# Patient Record
Sex: Male | Born: 1935
Health system: Southern US, Community
[De-identification: ages and names within clinical notes are randomized; demographics above are authoritative.]

## PROBLEM LIST (undated history)

## (undated) DIAGNOSIS — E785 Hyperlipidemia, unspecified: Secondary | ICD-10-CM

## (undated) DIAGNOSIS — E039 Hypothyroidism, unspecified: Secondary | ICD-10-CM

## (undated) HISTORY — PX: OTHER SURGICAL HISTORY: SHX169

---

## 2014-12-13 DIAGNOSIS — Z952 Presence of prosthetic heart valve: Secondary | ICD-10-CM | POA: Diagnosis not present

## 2014-12-13 DIAGNOSIS — I240 Acute coronary thrombosis not resulting in myocardial infarction: Secondary | ICD-10-CM | POA: Diagnosis not present

## 2015-01-11 DIAGNOSIS — E782 Mixed hyperlipidemia: Secondary | ICD-10-CM | POA: Diagnosis not present

## 2015-01-11 DIAGNOSIS — I240 Acute coronary thrombosis not resulting in myocardial infarction: Secondary | ICD-10-CM | POA: Diagnosis not present

## 2015-01-11 DIAGNOSIS — Z952 Presence of prosthetic heart valve: Secondary | ICD-10-CM | POA: Diagnosis not present

## 2015-01-11 DIAGNOSIS — D696 Thrombocytopenia, unspecified: Secondary | ICD-10-CM | POA: Diagnosis not present

## 2015-01-18 DIAGNOSIS — Z86010 Personal history of colonic polyps: Secondary | ICD-10-CM | POA: Diagnosis not present

## 2015-01-18 DIAGNOSIS — L400 Psoriasis vulgaris: Secondary | ICD-10-CM | POA: Diagnosis not present

## 2015-01-18 DIAGNOSIS — Z952 Presence of prosthetic heart valve: Secondary | ICD-10-CM | POA: Diagnosis not present

## 2015-01-18 DIAGNOSIS — E782 Mixed hyperlipidemia: Secondary | ICD-10-CM | POA: Diagnosis not present

## 2015-01-18 DIAGNOSIS — D696 Thrombocytopenia, unspecified: Secondary | ICD-10-CM | POA: Diagnosis not present

## 2015-02-22 DIAGNOSIS — Z952 Presence of prosthetic heart valve: Secondary | ICD-10-CM | POA: Diagnosis not present

## 2015-02-22 DIAGNOSIS — I240 Acute coronary thrombosis not resulting in myocardial infarction: Secondary | ICD-10-CM | POA: Diagnosis not present

## 2015-04-05 DIAGNOSIS — Z952 Presence of prosthetic heart valve: Secondary | ICD-10-CM | POA: Diagnosis not present

## 2015-05-08 DIAGNOSIS — Z952 Presence of prosthetic heart valve: Secondary | ICD-10-CM | POA: Diagnosis not present

## 2015-06-12 DIAGNOSIS — Z952 Presence of prosthetic heart valve: Secondary | ICD-10-CM | POA: Diagnosis not present

## 2015-07-17 DIAGNOSIS — Z952 Presence of prosthetic heart valve: Secondary | ICD-10-CM | POA: Diagnosis not present

## 2015-07-23 DIAGNOSIS — D696 Thrombocytopenia, unspecified: Secondary | ICD-10-CM | POA: Diagnosis not present

## 2015-07-23 DIAGNOSIS — E782 Mixed hyperlipidemia: Secondary | ICD-10-CM | POA: Diagnosis not present

## 2015-07-23 DIAGNOSIS — I2510 Atherosclerotic heart disease of native coronary artery without angina pectoris: Secondary | ICD-10-CM | POA: Diagnosis not present

## 2015-07-30 DIAGNOSIS — L400 Psoriasis vulgaris: Secondary | ICD-10-CM | POA: Diagnosis not present

## 2015-07-30 DIAGNOSIS — Z0001 Encounter for general adult medical examination with abnormal findings: Secondary | ICD-10-CM | POA: Diagnosis not present

## 2015-08-21 DIAGNOSIS — Z952 Presence of prosthetic heart valve: Secondary | ICD-10-CM | POA: Diagnosis not present

## 2015-08-30 DIAGNOSIS — Z23 Encounter for immunization: Secondary | ICD-10-CM | POA: Diagnosis not present

## 2015-09-18 DIAGNOSIS — Z952 Presence of prosthetic heart valve: Secondary | ICD-10-CM | POA: Diagnosis not present

## 2015-10-30 DIAGNOSIS — Z952 Presence of prosthetic heart valve: Secondary | ICD-10-CM | POA: Diagnosis not present

## 2015-12-04 DIAGNOSIS — I2510 Atherosclerotic heart disease of native coronary artery without angina pectoris: Secondary | ICD-10-CM | POA: Diagnosis not present

## 2015-12-04 DIAGNOSIS — D696 Thrombocytopenia, unspecified: Secondary | ICD-10-CM | POA: Diagnosis not present

## 2016-01-08 DIAGNOSIS — Z952 Presence of prosthetic heart valve: Secondary | ICD-10-CM | POA: Diagnosis not present

## 2016-01-24 DIAGNOSIS — Z86010 Personal history of colonic polyps: Secondary | ICD-10-CM | POA: Diagnosis not present

## 2016-01-24 DIAGNOSIS — D696 Thrombocytopenia, unspecified: Secondary | ICD-10-CM | POA: Diagnosis not present

## 2016-01-24 DIAGNOSIS — Z952 Presence of prosthetic heart valve: Secondary | ICD-10-CM | POA: Diagnosis not present

## 2016-01-24 DIAGNOSIS — E782 Mixed hyperlipidemia: Secondary | ICD-10-CM | POA: Diagnosis not present

## 2016-01-24 DIAGNOSIS — I2510 Atherosclerotic heart disease of native coronary artery without angina pectoris: Secondary | ICD-10-CM | POA: Diagnosis not present

## 2016-01-31 DIAGNOSIS — I2510 Atherosclerotic heart disease of native coronary artery without angina pectoris: Secondary | ICD-10-CM | POA: Diagnosis not present

## 2016-01-31 DIAGNOSIS — D696 Thrombocytopenia, unspecified: Secondary | ICD-10-CM | POA: Diagnosis not present

## 2016-01-31 DIAGNOSIS — E782 Mixed hyperlipidemia: Secondary | ICD-10-CM | POA: Diagnosis not present

## 2016-01-31 DIAGNOSIS — L400 Psoriasis vulgaris: Secondary | ICD-10-CM | POA: Diagnosis not present

## 2016-01-31 DIAGNOSIS — Z952 Presence of prosthetic heart valve: Secondary | ICD-10-CM | POA: Diagnosis not present

## 2016-01-31 DIAGNOSIS — Z1389 Encounter for screening for other disorder: Secondary | ICD-10-CM | POA: Diagnosis not present

## 2016-02-12 DIAGNOSIS — Z952 Presence of prosthetic heart valve: Secondary | ICD-10-CM | POA: Diagnosis not present

## 2016-03-18 DIAGNOSIS — I2510 Atherosclerotic heart disease of native coronary artery without angina pectoris: Secondary | ICD-10-CM | POA: Diagnosis not present

## 2016-03-18 DIAGNOSIS — Z952 Presence of prosthetic heart valve: Secondary | ICD-10-CM | POA: Diagnosis not present

## 2016-04-29 DIAGNOSIS — Z952 Presence of prosthetic heart valve: Secondary | ICD-10-CM | POA: Diagnosis not present

## 2016-06-03 DIAGNOSIS — D696 Thrombocytopenia, unspecified: Secondary | ICD-10-CM | POA: Diagnosis not present

## 2016-07-15 DIAGNOSIS — Z952 Presence of prosthetic heart valve: Secondary | ICD-10-CM | POA: Diagnosis not present

## 2016-07-19 DIAGNOSIS — B029 Zoster without complications: Secondary | ICD-10-CM | POA: Diagnosis not present

## 2016-08-14 DIAGNOSIS — I2510 Atherosclerotic heart disease of native coronary artery without angina pectoris: Secondary | ICD-10-CM | POA: Diagnosis not present

## 2016-08-14 DIAGNOSIS — Z952 Presence of prosthetic heart valve: Secondary | ICD-10-CM | POA: Diagnosis not present

## 2016-08-14 DIAGNOSIS — E782 Mixed hyperlipidemia: Secondary | ICD-10-CM | POA: Diagnosis not present

## 2016-08-14 DIAGNOSIS — D696 Thrombocytopenia, unspecified: Secondary | ICD-10-CM | POA: Diagnosis not present

## 2016-08-19 DIAGNOSIS — Z952 Presence of prosthetic heart valve: Secondary | ICD-10-CM | POA: Diagnosis not present

## 2016-08-21 DIAGNOSIS — L400 Psoriasis vulgaris: Secondary | ICD-10-CM | POA: Diagnosis not present

## 2016-08-21 DIAGNOSIS — Z0001 Encounter for general adult medical examination with abnormal findings: Secondary | ICD-10-CM | POA: Diagnosis not present

## 2016-08-21 DIAGNOSIS — Z1212 Encounter for screening for malignant neoplasm of rectum: Secondary | ICD-10-CM | POA: Diagnosis not present

## 2016-08-21 DIAGNOSIS — E782 Mixed hyperlipidemia: Secondary | ICD-10-CM | POA: Diagnosis not present

## 2016-08-21 DIAGNOSIS — Z23 Encounter for immunization: Secondary | ICD-10-CM | POA: Diagnosis not present

## 2016-08-21 DIAGNOSIS — I2510 Atherosclerotic heart disease of native coronary artery without angina pectoris: Secondary | ICD-10-CM | POA: Diagnosis not present

## 2016-08-21 DIAGNOSIS — D696 Thrombocytopenia, unspecified: Secondary | ICD-10-CM | POA: Diagnosis not present

## 2016-09-23 DIAGNOSIS — Z952 Presence of prosthetic heart valve: Secondary | ICD-10-CM | POA: Diagnosis not present

## 2016-10-21 DIAGNOSIS — Z952 Presence of prosthetic heart valve: Secondary | ICD-10-CM | POA: Diagnosis not present

## 2016-11-25 DIAGNOSIS — Z952 Presence of prosthetic heart valve: Secondary | ICD-10-CM | POA: Diagnosis not present

## 2016-12-30 DIAGNOSIS — Z952 Presence of prosthetic heart valve: Secondary | ICD-10-CM | POA: Diagnosis not present

## 2017-01-13 DIAGNOSIS — H353132 Nonexudative age-related macular degeneration, bilateral, intermediate dry stage: Secondary | ICD-10-CM | POA: Diagnosis not present

## 2017-01-13 DIAGNOSIS — H53001 Unspecified amblyopia, right eye: Secondary | ICD-10-CM | POA: Diagnosis not present

## 2017-01-13 DIAGNOSIS — H524 Presbyopia: Secondary | ICD-10-CM | POA: Diagnosis not present

## 2017-02-10 DIAGNOSIS — Z952 Presence of prosthetic heart valve: Secondary | ICD-10-CM | POA: Diagnosis not present

## 2017-02-17 DIAGNOSIS — D696 Thrombocytopenia, unspecified: Secondary | ICD-10-CM | POA: Diagnosis not present

## 2017-02-17 DIAGNOSIS — I2510 Atherosclerotic heart disease of native coronary artery without angina pectoris: Secondary | ICD-10-CM | POA: Diagnosis not present

## 2017-02-17 DIAGNOSIS — Z9189 Other specified personal risk factors, not elsewhere classified: Secondary | ICD-10-CM | POA: Diagnosis not present

## 2017-02-17 DIAGNOSIS — E782 Mixed hyperlipidemia: Secondary | ICD-10-CM | POA: Diagnosis not present

## 2017-02-17 DIAGNOSIS — Z86010 Personal history of colonic polyps: Secondary | ICD-10-CM | POA: Diagnosis not present

## 2017-02-20 DIAGNOSIS — I2510 Atherosclerotic heart disease of native coronary artery without angina pectoris: Secondary | ICD-10-CM | POA: Diagnosis not present

## 2017-02-20 DIAGNOSIS — Z952 Presence of prosthetic heart valve: Secondary | ICD-10-CM | POA: Diagnosis not present

## 2017-02-20 DIAGNOSIS — Z23 Encounter for immunization: Secondary | ICD-10-CM | POA: Diagnosis not present

## 2017-02-20 DIAGNOSIS — D696 Thrombocytopenia, unspecified: Secondary | ICD-10-CM | POA: Diagnosis not present

## 2017-02-20 DIAGNOSIS — L400 Psoriasis vulgaris: Secondary | ICD-10-CM | POA: Diagnosis not present

## 2017-02-20 DIAGNOSIS — E782 Mixed hyperlipidemia: Secondary | ICD-10-CM | POA: Diagnosis not present

## 2017-03-17 DIAGNOSIS — Z952 Presence of prosthetic heart valve: Secondary | ICD-10-CM | POA: Diagnosis not present

## 2017-04-21 DIAGNOSIS — Z952 Presence of prosthetic heart valve: Secondary | ICD-10-CM | POA: Diagnosis not present

## 2017-05-26 DIAGNOSIS — Z952 Presence of prosthetic heart valve: Secondary | ICD-10-CM | POA: Diagnosis not present

## 2017-06-30 DIAGNOSIS — Z952 Presence of prosthetic heart valve: Secondary | ICD-10-CM | POA: Diagnosis not present

## 2017-08-11 DIAGNOSIS — Z952 Presence of prosthetic heart valve: Secondary | ICD-10-CM | POA: Diagnosis not present

## 2017-08-31 DIAGNOSIS — R5383 Other fatigue: Secondary | ICD-10-CM | POA: Diagnosis not present

## 2017-08-31 DIAGNOSIS — E782 Mixed hyperlipidemia: Secondary | ICD-10-CM | POA: Diagnosis not present

## 2017-09-02 DIAGNOSIS — L400 Psoriasis vulgaris: Secondary | ICD-10-CM | POA: Diagnosis not present

## 2017-09-02 DIAGNOSIS — Z0001 Encounter for general adult medical examination with abnormal findings: Secondary | ICD-10-CM | POA: Diagnosis not present

## 2017-09-02 DIAGNOSIS — D696 Thrombocytopenia, unspecified: Secondary | ICD-10-CM | POA: Diagnosis not present

## 2017-09-02 DIAGNOSIS — Z23 Encounter for immunization: Secondary | ICD-10-CM | POA: Diagnosis not present

## 2017-09-22 DIAGNOSIS — Z952 Presence of prosthetic heart valve: Secondary | ICD-10-CM | POA: Diagnosis not present

## 2017-10-27 DIAGNOSIS — Z952 Presence of prosthetic heart valve: Secondary | ICD-10-CM | POA: Diagnosis not present

## 2017-11-03 DIAGNOSIS — Z952 Presence of prosthetic heart valve: Secondary | ICD-10-CM | POA: Diagnosis not present

## 2017-12-02 DIAGNOSIS — Z952 Presence of prosthetic heart valve: Secondary | ICD-10-CM | POA: Diagnosis not present

## 2017-12-30 DIAGNOSIS — Z952 Presence of prosthetic heart valve: Secondary | ICD-10-CM | POA: Diagnosis not present

## 2018-02-03 DIAGNOSIS — Z952 Presence of prosthetic heart valve: Secondary | ICD-10-CM | POA: Diagnosis not present

## 2018-02-25 DIAGNOSIS — D696 Thrombocytopenia, unspecified: Secondary | ICD-10-CM | POA: Diagnosis not present

## 2018-02-25 DIAGNOSIS — Z952 Presence of prosthetic heart valve: Secondary | ICD-10-CM | POA: Diagnosis not present

## 2018-02-25 DIAGNOSIS — I2510 Atherosclerotic heart disease of native coronary artery without angina pectoris: Secondary | ICD-10-CM | POA: Diagnosis not present

## 2018-02-25 DIAGNOSIS — E782 Mixed hyperlipidemia: Secondary | ICD-10-CM | POA: Diagnosis not present

## 2018-02-25 DIAGNOSIS — Z9189 Other specified personal risk factors, not elsewhere classified: Secondary | ICD-10-CM | POA: Diagnosis not present

## 2018-03-02 DIAGNOSIS — L400 Psoriasis vulgaris: Secondary | ICD-10-CM | POA: Diagnosis not present

## 2018-03-02 DIAGNOSIS — D696 Thrombocytopenia, unspecified: Secondary | ICD-10-CM | POA: Diagnosis not present

## 2018-03-02 DIAGNOSIS — Z952 Presence of prosthetic heart valve: Secondary | ICD-10-CM | POA: Diagnosis not present

## 2018-03-02 DIAGNOSIS — E782 Mixed hyperlipidemia: Secondary | ICD-10-CM | POA: Diagnosis not present

## 2018-03-02 DIAGNOSIS — I2510 Atherosclerotic heart disease of native coronary artery without angina pectoris: Secondary | ICD-10-CM | POA: Diagnosis not present

## 2018-03-17 DIAGNOSIS — Z952 Presence of prosthetic heart valve: Secondary | ICD-10-CM | POA: Diagnosis not present

## 2018-04-13 DIAGNOSIS — R5383 Other fatigue: Secondary | ICD-10-CM | POA: Diagnosis not present

## 2018-04-28 DIAGNOSIS — Z952 Presence of prosthetic heart valve: Secondary | ICD-10-CM | POA: Diagnosis not present

## 2018-05-26 DIAGNOSIS — R5383 Other fatigue: Secondary | ICD-10-CM | POA: Diagnosis not present

## 2018-05-26 DIAGNOSIS — R946 Abnormal results of thyroid function studies: Secondary | ICD-10-CM | POA: Diagnosis not present

## 2018-05-27 ENCOUNTER — Encounter (HOSPITAL_COMMUNITY): Payer: Self-pay

## 2018-05-27 ENCOUNTER — Inpatient Hospital Stay (HOSPITAL_COMMUNITY)
Admission: AD | Admit: 2018-05-27 | Discharge: 2018-06-02 | DRG: 813 | Disposition: A | Discharge status: Home / Self Care | Payer: Medicare Other | Source: Other Acute Inpatient Hospital | Attending: Internal Medicine | Admitting: Internal Medicine

## 2018-05-27 ENCOUNTER — Other Ambulatory Visit: Payer: Self-pay

## 2018-05-27 ENCOUNTER — Inpatient Hospital Stay (HOSPITAL_COMMUNITY): Payer: Medicare Other

## 2018-05-27 DIAGNOSIS — R531 Weakness: Secondary | ICD-10-CM | POA: Diagnosis not present

## 2018-05-27 DIAGNOSIS — Z7982 Long term (current) use of aspirin: Secondary | ICD-10-CM

## 2018-05-27 DIAGNOSIS — S3991XA Unspecified injury of abdomen, initial encounter: Secondary | ICD-10-CM | POA: Diagnosis not present

## 2018-05-27 DIAGNOSIS — J189 Pneumonia, unspecified organism: Secondary | ICD-10-CM | POA: Diagnosis not present

## 2018-05-27 DIAGNOSIS — Z952 Presence of prosthetic heart valve: Secondary | ICD-10-CM | POA: Diagnosis not present

## 2018-05-27 DIAGNOSIS — K922 Gastrointestinal hemorrhage, unspecified: Secondary | ICD-10-CM | POA: Diagnosis not present

## 2018-05-27 DIAGNOSIS — K2901 Acute gastritis with bleeding: Secondary | ICD-10-CM | POA: Diagnosis not present

## 2018-05-27 DIAGNOSIS — K921 Melena: Secondary | ICD-10-CM | POA: Diagnosis not present

## 2018-05-27 DIAGNOSIS — K2950 Unspecified chronic gastritis without bleeding: Secondary | ICD-10-CM | POA: Diagnosis not present

## 2018-05-27 DIAGNOSIS — R0600 Dyspnea, unspecified: Secondary | ICD-10-CM

## 2018-05-27 DIAGNOSIS — I38 Endocarditis, valve unspecified: Secondary | ICD-10-CM | POA: Diagnosis not present

## 2018-05-27 DIAGNOSIS — J181 Lobar pneumonia, unspecified organism: Secondary | ICD-10-CM | POA: Diagnosis present

## 2018-05-27 DIAGNOSIS — R042 Hemoptysis: Secondary | ICD-10-CM | POA: Diagnosis not present

## 2018-05-27 DIAGNOSIS — K2970 Gastritis, unspecified, without bleeding: Secondary | ICD-10-CM | POA: Diagnosis present

## 2018-05-27 DIAGNOSIS — T45515A Adverse effect of anticoagulants, initial encounter: Secondary | ICD-10-CM | POA: Diagnosis present

## 2018-05-27 DIAGNOSIS — R195 Other fecal abnormalities: Secondary | ICD-10-CM | POA: Diagnosis not present

## 2018-05-27 DIAGNOSIS — E039 Hypothyroidism, unspecified: Secondary | ICD-10-CM

## 2018-05-27 DIAGNOSIS — E785 Hyperlipidemia, unspecified: Secondary | ICD-10-CM | POA: Diagnosis present

## 2018-05-27 DIAGNOSIS — I700 Atherosclerosis of aorta: Secondary | ICD-10-CM | POA: Diagnosis not present

## 2018-05-27 DIAGNOSIS — Z79899 Other long term (current) drug therapy: Secondary | ICD-10-CM

## 2018-05-27 DIAGNOSIS — K449 Diaphragmatic hernia without obstruction or gangrene: Secondary | ICD-10-CM | POA: Diagnosis present

## 2018-05-27 DIAGNOSIS — D6489 Other specified anemias: Secondary | ICD-10-CM | POA: Diagnosis not present

## 2018-05-27 DIAGNOSIS — D689 Coagulation defect, unspecified: Secondary | ICD-10-CM

## 2018-05-27 DIAGNOSIS — Z86010 Personal history of colonic polyps: Secondary | ICD-10-CM

## 2018-05-27 DIAGNOSIS — Z7901 Long term (current) use of anticoagulants: Secondary | ICD-10-CM | POA: Diagnosis not present

## 2018-05-27 DIAGNOSIS — D62 Acute posthemorrhagic anemia: Secondary | ICD-10-CM | POA: Diagnosis not present

## 2018-05-27 DIAGNOSIS — Z87891 Personal history of nicotine dependence: Secondary | ICD-10-CM | POA: Diagnosis not present

## 2018-05-27 DIAGNOSIS — R05 Cough: Secondary | ICD-10-CM | POA: Diagnosis not present

## 2018-05-27 DIAGNOSIS — I712 Thoracic aortic aneurysm, without rupture: Secondary | ICD-10-CM | POA: Diagnosis not present

## 2018-05-27 DIAGNOSIS — K224 Dyskinesia of esophagus: Secondary | ICD-10-CM | POA: Diagnosis present

## 2018-05-27 DIAGNOSIS — D6832 Hemorrhagic disorder due to extrinsic circulating anticoagulants: Secondary | ICD-10-CM | POA: Diagnosis not present

## 2018-05-27 DIAGNOSIS — D500 Iron deficiency anemia secondary to blood loss (chronic): Secondary | ICD-10-CM | POA: Diagnosis not present

## 2018-05-27 DIAGNOSIS — R1031 Right lower quadrant pain: Secondary | ICD-10-CM | POA: Diagnosis not present

## 2018-05-27 DIAGNOSIS — K920 Hematemesis: Secondary | ICD-10-CM | POA: Diagnosis not present

## 2018-05-27 DIAGNOSIS — R0902 Hypoxemia: Secondary | ICD-10-CM | POA: Diagnosis not present

## 2018-05-27 DIAGNOSIS — R0602 Shortness of breath: Secondary | ICD-10-CM | POA: Diagnosis not present

## 2018-05-27 HISTORY — DX: Hypothyroidism, unspecified: E03.9

## 2018-05-27 HISTORY — DX: Hyperlipidemia, unspecified: E78.5

## 2018-05-27 LAB — CBC WITH DIFFERENTIAL/PLATELET
BASOS PCT: 0 %
Basophils Absolute: 0 10*3/uL (ref 0.0–0.1)
Eosinophils Absolute: 0.1 10*3/uL (ref 0.0–0.7)
Eosinophils Relative: 1 %
HCT: 24.9 % — ABNORMAL LOW (ref 39.0–52.0)
HEMOGLOBIN: 8.4 g/dL — AB (ref 13.0–17.0)
Lymphocytes Relative: 7 %
Lymphs Abs: 0.6 10*3/uL — ABNORMAL LOW (ref 0.7–4.0)
MCH: 32.1 pg (ref 26.0–34.0)
MCHC: 33.7 g/dL (ref 30.0–36.0)
MCV: 95 fL (ref 78.0–100.0)
MONOS PCT: 7 %
Monocytes Absolute: 0.5 10*3/uL (ref 0.1–1.0)
Neutro Abs: 6.7 10*3/uL (ref 1.7–7.7)
Neutrophils Relative %: 85 %
Platelets: 134 10*3/uL — ABNORMAL LOW (ref 150–400)
RBC: 2.62 MIL/uL — ABNORMAL LOW (ref 4.22–5.81)
RDW: 15.3 % (ref 11.5–15.5)
WBC: 7.9 10*3/uL (ref 4.0–10.5)

## 2018-05-27 LAB — BASIC METABOLIC PANEL
ANION GAP: 7 (ref 5–15)
BUN: 14 mg/dL (ref 6–20)
CHLORIDE: 105 mmol/L (ref 101–111)
CO2: 26 mmol/L (ref 22–32)
CREATININE: 1.23 mg/dL (ref 0.61–1.24)
Calcium: 7.9 mg/dL — ABNORMAL LOW (ref 8.9–10.3)
GFR calc non Af Amer: 52 mL/min — ABNORMAL LOW (ref >60)
Glucose, Bld: 119 mg/dL — ABNORMAL HIGH (ref 65–99)
Potassium: 3.3 mmol/L — ABNORMAL LOW (ref 3.5–5.1)
Sodium: 138 mmol/L (ref 135–145)

## 2018-05-27 MED ORDER — GUAIFENESIN-DM 100-10 MG/5ML PO SYRP
5.0000 mL | ORAL_SOLUTION | Freq: Four times a day (QID) | ORAL | Status: DC
  Administered 2018-05-27 – 2018-06-02 (×21): 5 mL via ORAL
  Filled 2018-05-27 (×23): qty 10

## 2018-05-27 MED ORDER — SODIUM CHLORIDE 0.9% FLUSH
3.0000 mL | Freq: Two times a day (BID) | INTRAVENOUS | Status: DC
  Administered 2018-05-28 – 2018-05-30 (×5): 3 mL via INTRAVENOUS

## 2018-05-27 MED ORDER — LEVOTHYROXINE SODIUM 100 MCG PO TABS
100.0000 ug | ORAL_TABLET | Freq: Every day | ORAL | Status: DC
  Administered 2018-05-28 – 2018-06-02 (×5): 100 ug via ORAL
  Filled 2018-05-27 (×5): qty 1

## 2018-05-27 MED ORDER — SODIUM CHLORIDE 0.9% FLUSH
3.0000 mL | INTRAVENOUS | Status: DC | PRN
  Administered 2018-06-02: 3 mL via INTRAVENOUS
  Filled 2018-05-27: qty 3

## 2018-05-27 MED ORDER — ONDANSETRON HCL 4 MG/2ML IJ SOLN: 4.0000 mg | Freq: Four times a day (QID) | INTRAMUSCULAR | Status: DC | PRN

## 2018-05-27 MED ORDER — SODIUM CHLORIDE 0.9 % IV SOLN: 250.0000 mL | INTRAVENOUS | Status: DC | PRN

## 2018-05-27 MED ORDER — IPRATROPIUM-ALBUTEROL 0.5-2.5 (3) MG/3ML IN SOLN
3.0000 mL | Freq: Three times a day (TID) | RESPIRATORY_TRACT | Status: DC
  Administered 2018-05-28 – 2018-05-31 (×10): 3 mL via RESPIRATORY_TRACT
  Filled 2018-05-27 (×10): qty 3

## 2018-05-27 MED ORDER — PIPERACILLIN-TAZOBACTAM 3.375 G IVPB
3.3750 g | Freq: Three times a day (TID) | INTRAVENOUS | Status: DC
  Administered 2018-05-27 – 2018-05-29 (×5): 3.375 g via INTRAVENOUS
  Filled 2018-05-27 (×6): qty 50

## 2018-05-27 MED ORDER — ACETAMINOPHEN 325 MG PO TABS: 650.0000 mg | ORAL_TABLET | Freq: Four times a day (QID) | ORAL | Status: DC | PRN

## 2018-05-27 MED ORDER — SIMVASTATIN 40 MG PO TABS
40.0000 mg | ORAL_TABLET | Freq: Every evening | ORAL | Status: DC
  Administered 2018-05-27 – 2018-06-01 (×6): 40 mg via ORAL
  Filled 2018-05-27 (×6): qty 1

## 2018-05-27 MED ORDER — ONDANSETRON HCL 4 MG PO TABS: 4.0000 mg | ORAL_TABLET | Freq: Four times a day (QID) | ORAL | Status: DC | PRN

## 2018-05-27 MED ORDER — IPRATROPIUM-ALBUTEROL 0.5-2.5 (3) MG/3ML IN SOLN
3.0000 mL | Freq: Four times a day (QID) | RESPIRATORY_TRACT | Status: DC
  Administered 2018-05-27: 3 mL via RESPIRATORY_TRACT
  Filled 2018-05-27: qty 3

## 2018-05-27 MED ORDER — ACETAMINOPHEN 650 MG RE SUPP: 650.0000 mg | Freq: Four times a day (QID) | RECTAL | Status: DC | PRN

## 2018-05-27 MED ORDER — IPRATROPIUM-ALBUTEROL 0.5-2.5 (3) MG/3ML IN SOLN: 3.0000 mL | RESPIRATORY_TRACT | Status: DC | PRN

## 2018-05-27 NOTE — H&P (Signed)
History and Physical    Eric JakschJimmy Marks ZOX:[id number] DOB: 05/07/1931 DOA: 05/27/2018  PCP: Eric Marks, Eric G, MD   Patient coming [facility]: [facility] department.  Chief Complaint: Hemoptysis.  HPI: Eric Marks is a 84 y.o. male with medical history significant of Saint Jude valve replacement, anticoagulated with warfarin, dyslipidemia and hypo-thyroidism.  Patient was diagnosed with acute bronchitis about 2 weeks ago, due to coughing, dyspnea and wheezing.  He received about 5 days of amoxicillin with a transient improvement of his symptoms.  The next 9 days he developed recurrent dyspnea, associated with wheezing and coughing, worse with exertion, no improving factors, moderate to severe in intensity.  He had noticed hemoptysis, associated with phlegm, no frank bright blood or blood clots expectorated.  He also had noticed blood in his stools mainly when he moves his bowels.  He was evaluated at the emergency department and [facility], where he is found to be severely anemic and coagulopathic.  Denies any abdominal or chest pain.  He quit smoking in 1997, until then he smoked about 1 pack of cigarettes daily since he was 84 years old.  He does not have nebulizers or inhalers at home, he has been not diagnosed with COPD or bronchitis.  Denies any seasonal or recurrent respiratory symptoms.   ED Course: Patient was found to be hemodynamically stable but anemic with hemoglobin of 6.6, INR was 6.8, he was transfused 1 unit of packed red blood cells.  Further work-up revealed a left lower lobe pneumonia and received IV Zosyn.  Pulmonary critical care Dr. Delton CoombesByrum was contacted for consultation.  No gastroenterology or pulmon[facility].   Review of Systems:  1. General: No fevers, no chills, no weight gain or weight loss 2. ENT: No runny nose or sore throat, no hearing disturbances 3. Pulmonary: Positive dyspnea, cough, wheezing, and hemoptysis as mentioned in  HPI 4. Cardiovascular: No angina, claudication, lower extremity edema, pnd or orthopnea 5. Gastrointestinal: Ocacsional nausea but no vomiting, no diarrhea or constipation 6. Hematology: No easy bruisability or frequent infections 7. Urology: No dysuria, hematuria or increased urinary frequency 8. Dermatology: No rashes. 9. Neurology: No seizures or paresthesias 10. Musculoskeletal: No joint pain or deformities  Past Medical History:  Diagnosis Date  . Hyperlipidemia   . Hypothyroidism     Past Surgical History:  Procedure Laterality Date  . St jude mechanical valve       reports that he quit smoking about 21 years ago. His smoking use included cigarettes. He smoked 1.00 pack per day. He has never used smokeless tobacco. He reports that he does not drink alcohol or use drugs.  Allergies  Allergen Reactions  . No Known Allergies     History reviewed. No pertinent family history.   Prior to Admission medications   Medication Sig Start Date End Date Taking? Authorizing Provider  acetaminophen (TYLENOL) 325 MG tablet Take 650 mg by mouth every 6 (six) hours as needed for moderate pain or fever.    Yes [provider]  aspirin EC 81 MG tablet Take 81 mg by mouth every evening.   Yes [provider]  levothyroxine (SYNTHROID, LEVOTHROID) 100 MCG tablet TAKE 1 TABLET BY MOUTH DAILY FOR HYPOTHYROIDISM 04/14/18  Yes [provider]  simvastatin (ZOCOR) 40 MG tablet Take 40 mg by mouth every evening.  04/27/18  Yes [provider]  warfarin (COUMADIN) 6 MG tablet Take 6 mg by mouth every evening.  04/27/18  Yes [provider]    Physical Exam: Vitals:   05/27/18 1702 05/27/18 1704  BP: (!) 119/96   Pulse: 76   Resp: 17   Temp: 98.4 F (36.9 C)   TempSrc: Oral   SpO2: 100%   Weight:  91.8 kg (202 lb 6.1 oz)  Height:  6\' 2"  (1.88 m)    Constitutional: deonditioned Vitals:   05/27/18 1702 05/27/18 1704  BP: (!) 119/96   Pulse: 76     Resp: 17   Temp: 98.4 F (36.9 C)   TempSrc: Oral   SpO2: 100%   Weight:  91.8 kg (202 lb 6.1 oz)  Height:  6\' 2"  (1.88 m)   Eyes: PERRL, lids and conjunctivae normal Head normocephalic, nose and ears with no deformities ENMT: Mucous membranes are dry. Posterior pharynx clear of any exudate or lesions.Normal dentition.  Neck: normal, supple, no masses, no thyromegaly Respiratory:  Decreased breath sounds on auscultation bilaterally at bases, with no wheezing,but bibasilar crackles more left than right. Normal respiratory effort. No accessory muscle use.  Cardiovascular: Regular rate and rhythm, no murmurs / rubs / gallops. No extremity edema. 2+ pedal pulses. No carotid bruits. Mechanical click S2.  Abdomen: no tenderness, no masses palpated. No hepatosplenomegaly. Bowel sounds positive.  Musculoskeletal: no clubbing / cyanosis. No joint deformity upper and lower extremities. Good ROM, no contractures. Normal muscle tone.  Skin: no rashes, lesions, ulcers. No induration Neurologic: CN 2-12 grossly intact. Sensation intact, DTR normal. Strength 5/5 in all 4.     Labs on Admission: I have personally reviewed following labs and imaging studies  CBC: No results for input(s): WBC, NEUTROABS, HGB, HCT, MCV, PLT in the last 168 hours. Basic Metabolic Panel: No results for input(s): NA, K, CL, CO2, GLUCOSE, BUN, CREATININE, CALCIUM, MG, PHOS in the last 168 hours. GFR: CrCl cannot be calculated (No order found.). Liver Function Tests: No results for input(s): AST, ALT, ALKPHOS, BILITOT, PROT, ALBUMIN in the last 168 hours. No results for input(s): LIPASE, AMYLASE in the last 168 hours. No results for input(s): AMMONIA in the last 168 hours. Coagulation Profile: No results for input(s): INR, PROTIME in the last 168 hours. Cardiac Enzymes: No results for input(s): CKTOTAL, CKMB, CKMBINDEX, TROPONINI in the last 168 hours. BNP (last 3 results) No results for input(s): PROBNP in the last  8760 hours. HbA1C: No results for input(s): HGBA1C in the last 72 hours. CBG: No results for input(s): GLUCAP in the last 168 hours. Lipid Profile: No results for input(s): CHOL, HDL, LDLCALC, TRIG, CHOLHDL, LDLDIRECT in the last 72 hours. Thyroid Function Tests: No results for input(s): TSH, T4TOTAL, FREET4, T3FREE, THYROIDAB in the last 72 hours. Anemia Panel: No results for input(s): VITAMINB12, FOLATE, FERRITIN, TIBC, IRON, RETICCTPCT in the last 72 hours. Urine analysis: No results found for: COLORURINE, APPEARANCEUR, LABSPEC, PHURINE, GLUCOSEU, HGBUR, BILIRUBINUR, KETONESUR, PROTEINUR, UROBILINOGEN, NITRITE, LEUKOCYTESUR  Radiological Exams on Admission: No results found.  EKG: Independently reviewed. NA  Assessment/Plan Active Problems:   Pneumonia  84 year old male who presented with acute symptomatic anemia, for the last 2 weeks he has been experiencing coughing, wheezing and dyspnea.  Reported hemoptysis and bright red blood per rectum.  He has been anticoagulated with warfarin, he did receive antibiotic therapy as an outpatient.  On his initial physical examination temperature 98.4, blood pressure 119/96, heart rate 76, respiratory rate 17, oxygen saturation 100% on 2 L nasal cannula.  Mild pale, lungs with decreased breath sounds at bases bilaterally with rales at the left  base, heart S1-S2 present, rhythmic, mechanical click/S2, abdomen soft nontender, no lower extremity edema.  Out-of-hospital INR 6.6, hemoglobin 6.8.  Reported chest CT with left lower lobe infiltrate.  Patient will be admitted to the hospital with a working diagnosis of acute symptomatic anemia due to vitamin K antagonist coagulopathy, complicated by left lower lobe pneumonia with hemoptysis and hematochezia.   1.  Acute symptomatic anemia, related to acute blood loss (gastrointestinal and pulmonary), due to vitamin K antagonist coagulopathy.  Patient received 1 unit of packed red blood cells, will continue  hemoglobin and hematocrit monitoring every 6 hours.  Will further transfuse PRBC, to target hemoglobin above 7.  Will stop warfarin, follow-up closely INR, in the setting of a mechanical valve will avoid acute correction of INR, unless patient has hemodynamic decompensation related to bleeding.  2.  Left lower lobe pneumonia.  Will repeat chest radiograph, for now continue antibiotic therapy with intravenous Zosyn.  Oximetry monitoring, supplemental oxygen per nasal cannula, bronchodilator therapy with DuoNeb.  Will collect sputum to quantify hemoptysis, likely this is not a case of massive hemoptysis.  Antitussive therapy with guaifenesin dextromethorphan.   3.  Dyslipidemia.  Continue simvastatin.  Stop aspirin.  4.  Hypothyroidism.  Continue levothyroxine.   DVT prophylaxis: scd  Code Status: full  Family Communication: I spoke with patient's family at the bedside and all questions were addressed.   Disposition Plan: to be determined  Consults called: none   Admission status: Inpatient.     Aquilla Shambley Annett Gula MD [facility] Pager [phone]  If 7PM-7AM, please contact night-coverage [email or web address] Password [facility]  05/27/2018, 5:39 PM

## 2018-05-27 NOTE — Progress Notes (Signed)
Patient had a 5 beat run of vtach. Pt up to the bathroom at time of run of vtach. Pt asymptomatic. VSS. NP on call notified.

## 2018-05-27 NOTE — Progress Notes (Signed)
Pharmacy Antibiotic Note  Eric Marks is a 84 y.o. male admitted on 05/27/2018 with pneumonia.  Pharmacy has been consulted for Zosyn dosing.  Plan: Zosyn 3.375g IV q8h (4 hour infusion).  Will sign off  Height: 6\' 2"  (188 cm) Weight: 202 lb 6.1 oz (91.8 kg) IBW/kg (Calculated) : 82.2  Temp (24hrs), Avg:98.4 F (36.9 C), Min:98.4 F (36.9 C), Max:98.4 F (36.9 C)  No results for input(s): WBC, CREATININE, LATICACIDVEN, VANCOTROUGH, VANCOPEAK, VANCORANDOM, GENTTROUGH, GENTPEAK, GENTRANDOM, TOBRATROUGH, TOBRAPEAK, TOBRARND, AMIKACINPEAK, AMIKACINTROU, AMIKACIN in the last 168 hours.  CrCl cannot be calculated (No order found.).    Allergies  Allergen Reactions  . No Known Allergies      Thank you for allowing pharmacy to be a part of this patient's care.   Hessie KnowsJustin M Kalan Marks, PharmD, BCPS Pager [phone] 05/27/2018 6:18 PM

## 2018-05-28 LAB — PROTIME-INR
INR: 2.29
Prothrombin Time: 25 seconds — ABNORMAL HIGH (ref 11.4–15.2)

## 2018-05-28 LAB — COMPREHENSIVE METABOLIC PANEL
ALT: 18 U/L (ref 17–63)
AST: 49 U/L — ABNORMAL HIGH (ref 15–41)
Albumin: 3.1 g/dL — ABNORMAL LOW (ref 3.5–5.0)
Alkaline Phosphatase: 37 U/L — ABNORMAL LOW (ref 38–126)
Anion gap: 8 (ref 5–15)
BUN: 14 mg/dL (ref 6–20)
CO2: 25 mmol/L (ref 22–32)
Calcium: 8 mg/dL — ABNORMAL LOW (ref 8.9–10.3)
Chloride: 105 mmol/L (ref 101–111)
Creatinine, Ser: 1.08 mg/dL (ref 0.61–1.24)
GFR calc Af Amer: >60 mL/min (ref >60)
GFR calc non Af Amer: >60 mL/min (ref >60)
Glucose, Bld: 122 mg/dL — ABNORMAL HIGH (ref 65–99)
Potassium: 3.6 mmol/L (ref 3.5–5.1)
Sodium: 138 mmol/L (ref 135–145)
Total Bilirubin: 1.2 mg/dL (ref 0.3–1.2)
Total Protein: 5.8 g/dL — ABNORMAL LOW (ref 6.5–8.1)

## 2018-05-28 LAB — HEMOGLOBIN AND HEMATOCRIT, BLOOD
HCT: 24 % — ABNORMAL LOW (ref 39.0–52.0)
HCT: 28.5 % — ABNORMAL LOW (ref 39.0–52.0)
HEMATOCRIT: 23.9 % — AB (ref 39.0–52.0)
Hemoglobin: 8.1 g/dL — ABNORMAL LOW (ref 13.0–17.0)
Hemoglobin: 8.1 g/dL — ABNORMAL LOW (ref 13.0–17.0)
Hemoglobin: 9.5 g/dL — ABNORMAL LOW (ref 13.0–17.0)

## 2018-05-28 MED ORDER — POTASSIUM CHLORIDE CRYS ER 20 MEQ PO TBCR
40.0000 meq | EXTENDED_RELEASE_TABLET | Freq: Once | ORAL | Status: AC
  Administered 2018-05-28: 40 meq via ORAL
  Filled 2018-05-28: qty 2

## 2018-05-28 MED ORDER — POTASSIUM CHLORIDE CRYS ER 20 MEQ PO TBCR: 20.0000 meq | EXTENDED_RELEASE_TABLET | Freq: Once | ORAL | Status: DC

## 2018-05-28 MED ORDER — WARFARIN SODIUM 3 MG PO TABS
3.0000 mg | ORAL_TABLET | Freq: Once | ORAL | Status: AC
  Administered 2018-05-28: 3 mg via ORAL
  Filled 2018-05-28: qty 1

## 2018-05-28 MED ORDER — WARFARIN - PHARMACIST DOSING INPATIENT: Freq: Every day | Status: DC

## 2018-05-28 NOTE — Progress Notes (Signed)
PROGRESS NOTE    Eric Marks  ZOX:[id number] DOB: Nov 09, 1931 DOA: 05/27/2018 PCP: Richardean Chimeraaniel, Terry G, MD    Brief Narrative:  84 year old male with past medical history for aortic mechanical valve replacement and dyslipidemia, who presented with acute symptomatic anemia, for the last 2 weeks he has been experiencing coughing, wheezing and dyspnea.  Reported hemoptysis and bright red blood per rectum.  He has been anticoagulated with warfarin, he did receive antibiotic therapy as an outpatient.  On his initial physical examination temperature 98.4, blood pressure 119/96, heart rate 76, respiratory rate 17, oxygen saturation 100% on 2 L nasal cannula.  Mild pale, lungs with decreased breath sounds at bases bilaterally with rales at the left base, heart S1-S2 present, rhythmic, mechanical click/S2, abdomen soft nontender, no lower extremity edema.  Out-of-hospital INR 6.6, hemoglobin 6.8.  Reported chest CT with left lower lobe infiltrate.  Patient wias admitted to the hospital with a working diagnosis of acute symptomatic anemia due to vitamin K antagonist coagulopathy, complicated by left lower lobe pneumonia with hemoptysis and hematochezia.    Assessment & Plan:   Active Problems:   Pneumonia  1. Acute symptomatic anemia, related to acute blood loss (GI and Pulm), in the setting of vitamin K antagonist coagulopathy. No further bleeding, hb stable at 8 after one unit prbc transfusion, INR down to 2,2. Will continue to follow on hb and hct, out of bed as tolerated and physical therapy evaluation.   2. Aortic mechanical valve. No clinical signs of heart failure, target INR 2 to 3, will resume warfarin tonight per pharmacy protocol and will continue close monitoring for signs of bleeding.   3. Left lower lobe pneumonia (present on admission). Chest film personally reviewed and confirmed left lower lobe infiltrate, also out of hospital CT personally reviewed with evidence of left lower lobe  infiltrate. Will continue antibiotic therapy with Zosyn. Patient oxygenating well, no fever or leukocytosis, no further cough or hemoptysis. Continue bronchodilator therapy and oxymetry monitoring. Oxygen saturation 95% on room air.   4. Acute lower GI bleed. No further hematochezia, will resume anticoagulation, patient tolerating po well. No clinical signs of significant source of gi bleeding.   5. Dyslipidemia. Continue simvastatin.  6. Hypothyroid. Continue levothyroxine.    DVT prophylaxis: warfarin   Code Status:  full Family Communication: I spoke with patient's family at the bedside and all questions were addressed.  Disposition Plan:  Home in am if no further beedomg   Consultants:     Procedures:     Antimicrobials:   Zosyn IV    Subjective: No further cough or hemoptysis, no further melena, no nausea or vomiting, dyspnea continue to improve but not back to baseline. No fever or chills.   Objective: Vitals:   05/27/18 1926 05/27/18 1950 05/28/18 0323 05/28/18 0821  BP:  125/87 (!) 111/58   Pulse:  89 82   Resp:  18 (!) 24   Temp:  98.4 F (36.9 C) 98.7 F (37.1 C)   TempSrc:  Oral Oral   SpO2: 99% 99% 95% 95%  Weight:      Height:        Intake/Output Summary (Last 24 hours) at 05/28/2018 1337 Last data filed at 05/28/2018 0854 Gross per 24 hour  Intake 720 ml  Output -  Net 720 ml   Filed Weights   05/27/18 1704  Weight: 91.8 kg (202 lb 6.1 oz)    Examination:   General: deconditioned  Neurology: Awake and alert, non focal  E ENT: no pallor, no icterus, oral mucosa moist Cardiovascular: No JVD. S1-S2 present, rhythmic, no gallops, rubs, or murmurs. No lower extremity edema. Pulmonary: decreased breath sounds at the left base, no wheezing or  Rhonchi, positive rales at the left base. Gastrointestinal. Abdomen with no organomegaly, non tender, no ebound or guarding Skin. No rashes Musculoskeletal: no joint deformities     Data Reviewed: I  have personally reviewed following labs and imaging studies  CBC: Recent Labs  Lab 05/27/18 2145 05/28/18 0009 05/28/18 0552 05/28/18 1221  WBC 7.9  --   --   --   NEUTROABS 6.7  --   --   --   HGB 8.4* 8.1* 8.1* 9.5*  HCT 24.9* 23.9* 24.0* 28.5*  MCV 95.0  --   --   --   PLT 134*  --   --   --    Basic Metabolic Panel: Recent Labs  Lab 05/27/18 2145 05/28/18 0552  NA 138 138  K 3.3* 3.6  CL 105 105  CO2 26 25  GLUCOSE 119* 122*  BUN 14 14  CREATININE 1.23 1.08  CALCIUM 7.9* 8.0*   GFR: Estimated Creatinine Clearance: 59.2 mL/min (by C-G formula based on SCr of 1.08 mg/dL). Liver Function Tests: Recent Labs  Lab 05/28/18 0552  AST 49*  ALT 18  ALKPHOS 37*  BILITOT 1.2  PROT 5.8*  ALBUMIN 3.1*   No results for input(s): LIPASE, AMYLASE in the last 168 hours. No results for input(s): AMMONIA in the last 168 hours. Coagulation Profile: Recent Labs  Lab 05/28/18 0552  INR 2.29   Cardiac Enzymes: No results for input(s): CKTOTAL, CKMB, CKMBINDEX, TROPONINI in the last 168 hours. BNP (last 3 results) No results for input(s): PROBNP in the last 8760 hours. HbA1C: No results for input(s): HGBA1C in the last 72 hours. CBG: No results for input(s): GLUCAP in the last 168 hours. Lipid Profile: No results for input(s): CHOL, HDL, LDLCALC, TRIG, CHOLHDL, LDLDIRECT in the last 72 hours. Thyroid Function Tests: No results for input(s): TSH, T4TOTAL, FREET4, T3FREE, THYROIDAB in the last 72 hours. Anemia Panel: No results for input(s): VITAMINB12, FOLATE, FERRITIN, TIBC, IRON, RETICCTPCT in the last 72 hours.    Radiology Studies: I have reviewed all of the imaging during this hospital visit personally     Scheduled Meds: . guaiFENesin-dextromethorphan  5 mL Oral Q6H  . ipratropium-albuterol  3 mL Nebulization TID  . levothyroxine  100 mcg Oral QAC breakfast  . simvastatin  40 mg Oral QPM  . sodium chloride flush  3 mL Intravenous Q12H   Continuous  Infusions: . sodium chloride    . piperacillin-tazobactam (ZOSYN)  IV Stopped (05/28/18 1325)     LOS: 1 day        Shahidah Nesbitt Annett Gula, MD [facility] Pager [phone]

## 2018-05-28 NOTE — Progress Notes (Signed)
ANTICOAGULATION CONSULT NOTE - Initial Consult  Pharmacy Consult for Warfarin Indication: aortic mechanical valve replacement   Allergies  Allergen Reactions  . No Known Allergies     Patient Measurements: Height: 6\' 2"  (188 cm) Weight: 202 lb 6.1 oz (91.8 kg) IBW/kg (Calculated) : 82.2  Vital Signs: Temp: 98.7 F (37.1 C) (06/21 0323) Temp Source: Oral (06/21 0323) BP: 111/58 (06/21 0323) Pulse Rate: 82 (06/21 0323)  Labs: Recent Labs    05/27/18 2145 05/28/18 0009 05/28/18 0552 05/28/18 1221  HGB 8.4* 8.1* 8.1* 9.5*  HCT 24.9* 23.9* 24.0* 28.5*  PLT 134*  --   --   --   LABPROT  --   --  25.0*  --   INR  --   --  2.29  --   CREATININE 1.23  --  1.08  --     Estimated Creatinine Clearance: 59.2 mL/min (by C-G formula based on SCr of 1.08 mg/dL).   Medical History: Past Medical History:  Diagnosis Date  . Hyperlipidemia   . Hypothyroidism     Medications:  Medications Prior to Admission  Medication Sig Dispense Refill Last Dose  . acetaminophen (TYLENOL) 325 MG tablet Take 650 mg by mouth every 6 (six) hours as needed for moderate pain or fever.    unknown  . aspirin EC 81 MG tablet Take 81 mg by mouth every evening.   05/26/2018 at Unknown time  . levothyroxine (SYNTHROID, LEVOTHROID) 100 MCG tablet TAKE 1 TABLET BY MOUTH DAILY FOR HYPOTHYROIDISM  3 05/27/2018 at Unknown time  . simvastatin (ZOCOR) 40 MG tablet Take 40 mg by mouth every evening.    05/26/2018 at Unknown time  . warfarin (COUMADIN) 6 MG tablet Take 6 mg by mouth every evening.    05/26/2018 at 1900   Scheduled:  . guaiFENesin-dextromethorphan  5 mL Oral Q6H  . ipratropium-albuterol  3 mL Nebulization TID  . levothyroxine  100 mcg Oral QAC breakfast  . potassium chloride  40 mEq Oral Once  . simvastatin  40 mg Oral QPM  . sodium chloride flush  3 mL Intravenous Q12H    Assessment: 86 yo[facility] on [facility] with pneumonia, and anemia related to blood loss (GI and  pulmonary).  PMH includes chronic warfarin anticoagulation for aortic mechanical valve replacement.  Since admission he has had no further bleeding and labs remain stable.  Pharmacy is consulted to resume warfarin dosing.   PTA warfarin 6 mg daily, last dose on 6/19 at 1900.  It is also reported that he had taken 5 days of Amoxicillin (not listed on med rec).  Admission INR reported to be 6.8 (not [facility])  Today, 05/28/2018   INR decreased to 2.29   Vit K 10mg  IM [facility] on 05/27/18 at 11:15 AM.   [facility] d[facility]  CBC:  Hgb improved to 9.5 (RN reports he had one unit PRBC [facility] and one unit PRBC [facility])  No further bleeding reported  Drug-drug interactions: broad spectrum antibiotics (Zosyn 6/20 >>) may prolong INR.  Vitamin K dose given intramuscularly may suppress INR and may increase risk of hematoma formation.  Goal of Therapy:  INR 2-3 Monitor platelets by anticoagulation protocol: Yes   Plan:  Warfarin 3 mg PO x 1 dose tonight at 18:00 Daily PT/INR. Monitor for signs and symptoms of bleeding.  Lynann Beaverhristine Zeeva Courser PharmD, BCPS Pager [phone] 05/28/2018  2:24 PM

## 2018-05-28 NOTE — Progress Notes (Signed)
RT placed pt on RA. SATS 95%

## 2018-05-28 NOTE — Progress Notes (Signed)
CMT notified RN that patient had 7 beat run of vtach. When assessing patient, patient denied chest pains and is asymptomatic. Patient had just ambulated to bathroom. Attending notified. Will continue to monitor.

## 2018-05-29 ENCOUNTER — Inpatient Hospital Stay (HOSPITAL_COMMUNITY): Payer: Medicare Other

## 2018-05-29 DIAGNOSIS — D62 Acute posthemorrhagic anemia: Secondary | ICD-10-CM

## 2018-05-29 DIAGNOSIS — I38 Endocarditis, valve unspecified: Secondary | ICD-10-CM

## 2018-05-29 DIAGNOSIS — D689 Coagulation defect, unspecified: Secondary | ICD-10-CM

## 2018-05-29 DIAGNOSIS — D6489 Other specified anemias: Secondary | ICD-10-CM

## 2018-05-29 DIAGNOSIS — Z952 Presence of prosthetic heart valve: Secondary | ICD-10-CM

## 2018-05-29 LAB — BASIC METABOLIC PANEL
Anion gap: 9 (ref 5–15)
BUN: 11 mg/dL (ref 6–20)
CALCIUM: 7.4 mg/dL — AB (ref 8.9–10.3)
CO2: 24 mmol/L (ref 22–32)
Chloride: 105 mmol/L (ref 101–111)
Creatinine, Ser: 0.95 mg/dL (ref 0.61–1.24)
GLUCOSE: 109 mg/dL — AB (ref 65–99)
POTASSIUM: 3.7 mmol/L (ref 3.5–5.1)
Sodium: 138 mmol/L (ref 135–145)

## 2018-05-29 LAB — CBC WITH DIFFERENTIAL/PLATELET
BASOS PCT: 0 %
Basophils Absolute: 0 10*3/uL (ref 0.0–0.1)
EOS PCT: 4 %
Eosinophils Absolute: 0.3 10*3/uL (ref 0.0–0.7)
HEMATOCRIT: 23.9 % — AB (ref 39.0–52.0)
Hemoglobin: 8 g/dL — ABNORMAL LOW (ref 13.0–17.0)
LYMPHS PCT: 11 %
Lymphs Abs: 0.8 10*3/uL (ref 0.7–4.0)
MCH: 32 pg (ref 26.0–34.0)
MCHC: 33.5 g/dL (ref 30.0–36.0)
MCV: 95.6 fL (ref 78.0–100.0)
MONO ABS: 0.5 10*3/uL (ref 0.1–1.0)
Monocytes Relative: 8 %
NEUTROS ABS: 5.3 10*3/uL (ref 1.7–7.7)
Neutrophils Relative %: 77 %
PLATELETS: 142 10*3/uL — AB (ref 150–400)
RBC: 2.5 MIL/uL — AB (ref 4.22–5.81)
RDW: 15.5 % (ref 11.5–15.5)
WBC: 6.9 10*3/uL (ref 4.0–10.5)

## 2018-05-29 LAB — PROTIME-INR
INR: 1.38
Prothrombin Time: 16.8 seconds — ABNORMAL HIGH (ref 11.4–15.2)

## 2018-05-29 LAB — PROCALCITONIN: Procalcitonin: 0.49 ng/mL

## 2018-05-29 MED ORDER — ENOXAPARIN SODIUM 100 MG/ML ~~LOC~~ SOLN
1.0000 mg/kg | Freq: Two times a day (BID) | SUBCUTANEOUS | Status: DC
  Administered 2018-05-29 (×2): 90 mg via SUBCUTANEOUS
  Filled 2018-05-29 (×2): qty 1

## 2018-05-29 MED ORDER — WARFARIN SODIUM 5 MG PO TABS
5.0000 mg | ORAL_TABLET | Freq: Once | ORAL | Status: AC
  Administered 2018-05-29: 5 mg via ORAL
  Filled 2018-05-29: qty 1

## 2018-05-29 NOTE — Progress Notes (Signed)
PROGRESS NOTE    Eric JakschJimmy Pelzer  ZOX:[id number] DOB: 10-10-1931 DOA: 05/27/2018 PCP: Richardean Chimeraaniel, Terry G, MD    Brief Narrative:  84 year old male with past medical history for aortic mechanical valve replacement and dyslipidemia, who presented with acute symptomatic anemia, for the last 2 weeks he has been experiencing coughing, wheezing and dyspnea. Reported hemoptysis and bright red blood per rectum. He has been anticoagulated with warfarin, he did receive antibiotic therapy as an outpatient. On his initial physical examination temperature 98.4, blood pressure 119/96, heart rate 76, respiratory rate 17, oxygen saturation 100% on 2 L nasal cannula. Mild pale, lungs with decreased breath sounds at bases bilaterally with rales at the left base, heart S1-S2 present, rhythmic, mechanical click/S2, abdomen soft nontender, no lower extremity edema. Out-of-hospital INR 6.6, hemoglobin 6.8.Reported chest CT with left lower lobe infiltrate.  Patient wias admitted to the hospital with a working diagnosis of acute symptomatic anemia due to vitamin K antagonist coagulopathy, complicated by left lower lobe pneumonia with hemoptysis and hematochezia.    Assessment & Plan:   Active Problems:   Pneumonia   1. Acute symptomatic anemia, related to acute blood loss ( possible GI ), in the setting of vitamin K antagonist coagulopathy sp one unit prbc transfusion. Hb has remained stable at 8.0/ hct 23.9. Will follow cell count in am. Will check hem occult stool, if positive may need upper endoscopy, specially in the setting of chronic anticoagulation.   2. Aortic mechanical valve. INR today has been subtherapeutic, down to 1,3, will start enoxaparin bridging, target INR 2 to 3 for aortic mechanical valve.   3. Left lower lobe pneumonia (present on admission). Patient received 5 days antibiotic therapy as outpatient, procalcitonin 0.49, follow chest film with unchanged infiltrate on the right lower lobe. No  further fever or leukocytosis. Will continue to hold antibiotic therapy and will follow procalcitonin in 48 hours. Continue bronchodilator therapy with duoneb.     4. Acute lower GI bleed/ upper. Will check hem occult, if positive will consult GI for further evaluation. Continue bid ppi, tolerating po well, no nausea or vomiting, no abdominal pain.   5. Dyslipidemia. On simvastatin.  6. Hypothyroid. On levothyroxine.    DVT prophylaxis: warfarin   Code Status:  full Family Communication: no family at the bedside  Disposition Plan:  waiting INR to be therapeutic, may bee need endoscopic procedure if continue gi bleeding   Consultants:     Procedures:     Antimicrobials:      Subjective: Patient had episode of desaturation last night, corrected with supplemental 02 per [place]. Reported black stools, no further hemoptysis, no nausea or vomiting, positive cough but no worsening dyspnea.   Objective: Vitals:   05/28/18 0821 05/28/18 2139 05/29/18 0415 05/29/18 0821  BP:  111/64 (!) 122/57   Pulse:  88 69   Resp:  (!) 24 (!) 24   Temp:  99.6 F (37.6 C) 98.3 F (36.8 C)   TempSrc:  Oral Oral   SpO2: 95% (!) 85% 93% 92%  Weight:      Height:        Intake/Output Summary (Last 24 hours) at 05/29/2018 0914 Last data filed at 05/29/2018 0406 Gross per 24 hour  Intake 493.13 ml  Output 275 ml  Net 218.13 ml   Filed Weights   05/27/18 1704  Weight: 91.8 kg (202 lb 6.1 oz)    Examination:   General: Not in pain or dyspnea, deconditioned  Neurology: Awake and alert, non  focal  E ENT: mild pallor, no icterus, oral mucosa moist Cardiovascular: No JVD. S1-S2 present, rhythmic, no gallops, rubs, or murmurs. No lower extremity edema. Pulmonary: positive breath sounds bilaterally, adequate air movement, no wheezing, or rhonchi, bibasilar rales. Gastrointestinal. Abdomen with no organomegaly, non tender, no rebound or guarding Skin. No rashes Musculoskeletal: no  joint deformities     Data Reviewed: I have personally reviewed following labs and imaging studies  CBC: Recent Labs  Lab 05/27/18 2145 05/28/18 0009 05/28/18 0552 05/28/18 1221 05/29/18 0541  WBC 7.9  --   --   --  6.9  NEUTROABS 6.7  --   --   --  5.3  HGB 8.4* 8.1* 8.1* 9.5* 8.0*  HCT 24.9* 23.9* 24.0* 28.5* 23.9*  MCV 95.0  --   --   --  95.6  PLT 134*  --   --   --  142*   Basic Metabolic Panel: Recent Labs  Lab 05/27/18 2145 05/28/18 0552 05/29/18 0541  NA 138 138 138  K 3.3* 3.6 3.7  CL 105 105 105  CO2 26 25 24   GLUCOSE 119* 122* 109*  BUN 14 14 11   CREATININE 1.23 1.08 0.95  CALCIUM 7.9* 8.0* 7.4*   GFR: Estimated Creatinine Clearance: 67.3 mL/min (by C-G formula based on SCr of 0.95 mg/dL). Liver Function Tests: Recent Labs  Lab 05/28/18 0552  AST 49*  ALT 18  ALKPHOS 37*  BILITOT 1.2  PROT 5.8*  ALBUMIN 3.1*   No results for input(s): LIPASE, AMYLASE in the last 168 hours. No results for input(s): AMMONIA in the last 168 hours. Coagulation Profile: Recent Labs  Lab 05/28/18 0552 05/29/18 0541  INR 2.29 1.38   Cardiac Enzymes: No results for input(s): CKTOTAL, CKMB, CKMBINDEX, TROPONINI in the last 168 hours. BNP (last 3 results) No results for input(s): PROBNP in the last 8760 hours. HbA1C: No results for input(s): HGBA1C in the last 72 hours. CBG: No results for input(s): GLUCAP in the last 168 hours. Lipid Profile: No results for input(s): CHOL, HDL, LDLCALC, TRIG, CHOLHDL, LDLDIRECT in the last 72 hours. Thyroid Function Tests: No results for input(s): TSH, T4TOTAL, FREET4, T3FREE, THYROIDAB in the last 72 hours. Anemia Panel: No results for input(s): VITAMINB12, FOLATE, FERRITIN, TIBC, IRON, RETICCTPCT in the last 72 hours.    Radiology Studies: I have reviewed all of the imaging during this hospital visit personally     Scheduled Meds: . enoxaparin (LOVENOX) injection  1 mg/kg Subcutaneous BID  .  guaiFENesin-dextromethorphan  5 mL Oral Q6H  . ipratropium-albuterol  3 mL Nebulization TID  . levothyroxine  100 mcg Oral QAC breakfast  . simvastatin  40 mg Oral QPM  . sodium chloride flush  3 mL Intravenous Q12H  . Warfarin - Pharmacist Dosing Inpatient   Does not apply q1800   Continuous Infusions: . sodium chloride       LOS: 2 days        Coralie Keens, MD [facility] Pager [phone]

## 2018-05-29 NOTE — Progress Notes (Addendum)
ANTICOAGULATION CONSULT NOTE  Pharmacy Consult for Warfarin + Lovenox Indication: aortic mechanical valve replacement   Allergies  Allergen Reactions  . No Known Allergies     Patient Measurements: Height: 6\' 2"  (188 cm) Weight: 202 lb 6.1 oz (91.8 kg) IBW/kg (Calculated) : 82.2  Vital Signs: Temp: 98.3 F (36.8 C) (06/22 0415) Temp Source: Oral (06/22 0415) BP: 122/57 (06/22 0415) Pulse Rate: 69 (06/22 0415)  Labs: Recent Labs    05/27/18 2145  05/28/18 0552 05/28/18 1221 05/29/18 0541  HGB 8.4*   < > 8.1* 9.5* 8.0*  HCT 24.9*   < > 24.0* 28.5* 23.9*  PLT 134*  --   --   --  142*  LABPROT  --   --  25.0*  --  16.8*  INR  --   --  2.29  --  1.38  CREATININE 1.23  --  1.08  --  0.95   < > = values in this interval not displayed.    Estimated Creatinine Clearance: 67.3 mL/min (by C-G formula based on SCr of 0.95 mg/dL).  Medications:  Medications Prior to Admission  Medication Sig Dispense Refill Last Dose  . acetaminophen (TYLENOL) 325 MG tablet Take 650 mg by mouth every 6 (six) hours as needed for moderate pain or fever.    unknown  . aspirin EC 81 MG tablet Take 81 mg by mouth every evening.   05/26/2018 at Unknown time  . levothyroxine (SYNTHROID, LEVOTHROID) 100 MCG tablet TAKE 1 TABLET BY MOUTH DAILY FOR HYPOTHYROIDISM  3 05/27/2018 at Unknown time  . simvastatin (ZOCOR) 40 MG tablet Take 40 mg by mouth every evening.    05/26/2018 at Unknown time  . warfarin (COUMADIN) 6 MG tablet Take 6 mg by mouth every evening.    05/26/2018 at 1900   Scheduled:  . enoxaparin (LOVENOX) injection  1 mg/kg Subcutaneous BID  . guaiFENesin-dextromethorphan  5 mL Oral Q6H  . ipratropium-albuterol  3 mL Nebulization TID  . levothyroxine  100 mcg Oral QAC breakfast  . simvastatin  40 mg Oral QPM  . sodium chloride flush  3 mL Intravenous Q12H  . Warfarin - Pharmacist Dosing Inpatient   Does not apply q1800    Assessment: 35 yoM admitted from [facility] on [facility]/20 with  pneumonia, and anemia related to blood loss (GI and pulmonary). PMH includes chronic warfarin anticoagulation for aortic mechanical valve replacement. Since admission he has had no further bleeding and labs remain stable. Pharmacy is consulted to resume warfarin dosing.   PTA warfarin 6 mg daily, last dose on 6/19 at 1900. It is also reported that he had taken 5 days of Amoxicillin (not listed on med rec).  Admission INR reported to be 6.8 (not available in [facility])  Significant events:  6/20: Vit K 10mg  IM given at [facility]; RN reports he had one unit PRBC at [facility] and one unit PRBC during [facility]  6/22: MD requesting Lovenox per pharmacy while INR subtherapeutic  Today, 05/29/2018   INR now subtherapeutic   CBC: Hgb back down to 8.0 after transfusion yesterday; Plt slightly low but stable since admission  No overt bleeding overnight; urine clear but patient did report one stool that was dark  Drug-drug interactions: broad spectrum antibiotics (Zosyn 6/20 >>) may prolong INR. Vitamin K dose given intramuscularly may suppress INR and may increase risk of hematoma formation.  Goal of Therapy:  INR 2-3 Monitor platelets by anticoagulation protocol: Yes   Plan:  Warfarin 5 mg PO x 1 tonight at 18:00; will increase dose now that subtherapeutic, but will be conservative given high admission INR on PTA regimen  Lovenox 90 mg SQ bid, to continue until INR back in therapeutic range  [facility] indicate [facility] goal range; no obvious risk factors that would require a higher goal range, but will attempt to confirm [facility]  Daily PT/INR, SCr weekly while on Lovenox  Monitor for signs and symptoms of bleeding  Bernadene Personrew Kento Gossman, PharmD, BCPS [phone] 05/29/2018, 8:33 AM

## 2018-05-30 ENCOUNTER — Inpatient Hospital Stay (HOSPITAL_COMMUNITY): Payer: Medicare Other

## 2018-05-30 DIAGNOSIS — K922 Gastrointestinal hemorrhage, unspecified: Secondary | ICD-10-CM

## 2018-05-30 DIAGNOSIS — R0600 Dyspnea, unspecified: Secondary | ICD-10-CM

## 2018-05-30 DIAGNOSIS — I38 Endocarditis, valve unspecified: Secondary | ICD-10-CM

## 2018-05-30 LAB — CBC WITH DIFFERENTIAL/PLATELET
BASOS ABS: 0 10*3/uL (ref 0.0–0.1)
Basophils Relative: 0 %
Eosinophils Absolute: 0.4 10*3/uL (ref 0.0–0.7)
Eosinophils Relative: 7 %
HEMATOCRIT: 25.2 % — AB (ref 39.0–52.0)
Hemoglobin: 8.3 g/dL — ABNORMAL LOW (ref 13.0–17.0)
LYMPHS ABS: 0.7 10*3/uL (ref 0.7–4.0)
LYMPHS PCT: 12 %
MCH: 31.7 pg (ref 26.0–34.0)
MCHC: 32.9 g/dL (ref 30.0–36.0)
MCV: 96.2 fL (ref 78.0–100.0)
MONO ABS: 0.5 10*3/uL (ref 0.1–1.0)
MONOS PCT: 8 %
NEUTROS ABS: 4.3 10*3/uL (ref 1.7–7.7)
Neutrophils Relative %: 73 %
PLATELETS: 162 10*3/uL (ref 150–400)
RBC: 2.62 MIL/uL — AB (ref 4.22–5.81)
RDW: 15.7 % — ABNORMAL HIGH (ref 11.5–15.5)
WBC: 6 10*3/uL (ref 4.0–10.5)

## 2018-05-30 LAB — ECHOCARDIOGRAM COMPLETE
Height: 74 in
Weight: 3238.12 oz

## 2018-05-30 LAB — HEPARIN LEVEL (UNFRACTIONATED): Heparin Unfractionated: 0.26 IU/mL — ABNORMAL LOW (ref 0.30–0.70)

## 2018-05-30 LAB — PROTIME-INR
INR: 1.33
Prothrombin Time: 16.4 seconds — ABNORMAL HIGH (ref 11.4–15.2)

## 2018-05-30 MED ORDER — ENOXAPARIN SODIUM 100 MG/ML ~~LOC~~ SOLN: 1.0000 mg/kg | Freq: Two times a day (BID) | SUBCUTANEOUS | Status: DC

## 2018-05-30 MED ORDER — PANTOPRAZOLE SODIUM 40 MG PO TBEC: 40.0000 mg | DELAYED_RELEASE_TABLET | Freq: Every day | ORAL | Status: DC

## 2018-05-30 MED ORDER — WARFARIN - PHARMACIST DOSING INPATIENT: Freq: Every day | Status: DC

## 2018-05-30 MED ORDER — PANTOPRAZOLE SODIUM 40 MG PO TBEC
40.0000 mg | DELAYED_RELEASE_TABLET | Freq: Two times a day (BID) | ORAL | Status: DC
  Administered 2018-05-30 – 2018-06-02 (×5): 40 mg via ORAL
  Filled 2018-05-30 (×5): qty 1

## 2018-05-30 MED ORDER — POTASSIUM CHLORIDE CRYS ER 20 MEQ PO TBCR
40.0000 meq | EXTENDED_RELEASE_TABLET | Freq: Once | ORAL | Status: AC
  Administered 2018-05-30: 40 meq via ORAL
  Filled 2018-05-30: qty 2

## 2018-05-30 MED ORDER — HEPARIN (PORCINE) IN NACL 100-0.45 UNIT/ML-% IJ SOLN
1550.0000 [IU]/h | INTRAMUSCULAR | Status: AC
  Administered 2018-05-30: 1450 [IU]/h via INTRAVENOUS
  Filled 2018-05-30 (×2): qty 250

## 2018-05-30 MED ORDER — WARFARIN SODIUM 6 MG PO TABS: 6.0000 mg | ORAL_TABLET | Freq: Once | ORAL | Status: DC

## 2018-05-30 NOTE — Progress Notes (Signed)
ANTICOAGULATION CONSULT NOTE  Pharmacy Consult for IV heparin while warfarin on hold Indication: aortic mechanical valve replacement   Please see previous progress note from Bernadene Personrew Wofford, PharmD for full details.  Pharmacy dosing IV heparin while warfarin on hold.  Heparin level = 0.26 (subtherapeutic) on heparin 1450 units/hr No bleeding reported per discussion with RN  Plan: - Increase heparin to 1550 units/hr - Heparin to stop at 03:00 tomorrow for EGD at 09:00 - F/u plans for resuming anticoagulations post-procedure  Loralee PacasErin Jizel Cheeks, PharmD, BCPS Pager: [phone] 05/30/2018 8:34 PM

## 2018-05-30 NOTE — Progress Notes (Addendum)
PROGRESS NOTE    Eric Marks  WUJ:[id number] DOB: [birth year] DOA: 05/27/2018 PCP: [name], MD    Brief Narrative:  84 year old malewith past medical history for aortic mechanical valve replacement and dyslipidemia,who presented with acute symptomatic anemia, for the last 2 weeks he has been experiencing coughing, wheezing and dyspnea. Reported hemoptysis and bright red blood per rectum. He has been anticoagulated with warfarin, he did receive antibiotic therapy as an outpatient. On his initial physical examination temperature 98.4, blood pressure 119/96, heart rate 76, respiratory rate 17, oxygen saturation 100% on 2 L nasal cannula. Mild pale, lungs with decreased breath sounds at bases bilaterally with rales at the left base, heart S1-S2 present, rhythmic, mechanical click/S2, abdomen soft nontender, no lower extremity edema. Out-of-hospital INR 6.6, hemoglobin 6.8.Reported chest CT with left lower lobe infiltrate.  Patient wiasadmitted to the hospital with a working diagnosis of acute symptomatic anemia due to vitamin K antagonist coagulopathy, complicated by left lower lobe pneumonia with hemoptysis and hematochezia.      Assessment & Plan:   Active Problems:   Pneumonia   Coagulopathy (HCC)   Acute posthemorrhagic anemia   Valvular heart disease   History of aortic valve replacement   1. Acute symptomatic anemia, related to acute blood loss ( possible GI ), in the setting of vitamin K antagonist coagulopathy sp one unit prbc transfusion. No bowel movements over last 24 hours, hb and hct have remained stable at 8,3 and 25. Considering significant melena and severe anemia on admission will consult GI for possible endoscopy.   2. Aortic mechanical valve. INR continue to be at 1,33 in preparation for EGD will hold tonight's warfarin and will continue bridge anticoagulation with IV heparin. Patient with episodes of VT will follow on echocardiogram. Will keep K at 4 and  Mg at 2.   3. Left lower lobe pneumonia (present on admission). Check pro-calcitonine in am if trending down will continue to hold antibiotic therapy. Follow up chest film personally reviewed, noted no significant change, positive infiltrate at the left lower lobe. Continue bronchodilator and supplemental 02 per [name].  4. Acute lower GI bleed/ upper. Continue pantoprazole po, will follow on GI recommendations, patient has not had EGD in the past, and is on life long anticoagulation due to mechanical heart valve.   5. Dyslipidemia. Continue simvastatin.  6. Hypothyroid. Continue levothyroxine.   DVT prophylaxis:warfarin Code Status:full Family Communication:no family at the bedside  Disposition Plan:waiting INR to be therapeutic, may bee need endoscopic procedure if continue gi bleeding   Consultants:    Procedures:    Antimicrobials:     Subjective: Patient had no bowel movement over last 24 hours, but he did have melena before admission, currently no nausea or vomiting, no chest pain or dyspnea. Unable to wean off oxygen.   Objective: Vitals:   05/29/18 2034 05/29/18 2300 05/30/18 0513 05/30/18 0829  BP: (!) 121/56  111/66   Pulse: 73  76   Resp: 18  19   Temp: 98.9 F (37.2 C)  98.5 F (36.9 C)   TempSrc: Oral  Oral   SpO2: 96% 96% 98% 96%  Weight:      Height:        Intake/Output Summary (Last 24 hours) at 05/30/2018 0917 Last data filed at 05/30/2018 0700 Gross per 24 hour  Intake 840 ml  Output 2150 ml  Net -1310 ml   Filed Weights   05/27/18 1704  Weight: 91.8 kg (202 lb 6.1 oz)  Examination:   General: deconditioned  Neurology: Awake and alert, non focal  E ENT: mild pallor, no icterus, oral mucosa moist Cardiovascular: No JVD. S1-S2 present, rhythmic, no gallops, rubs, or murmurs. No lower extremity edema. Pulmonary: decreased breath sounds at left base, adequate air movement, no wheezing, rhonchi or  rales. Gastrointestinal. Abdomen protuberant with no organomegaly, non tender, no rebound or guarding Skin. No rashes Musculoskeletal: no joint deformities     Data Reviewed: I have personally reviewed following labs and imaging studies  CBC: Recent Labs  Lab 05/27/18 2145 05/28/18 0009 05/28/18 0552 05/28/18 1221 05/29/18 0541 05/30/18 0540  WBC 7.9  --   --   --  6.9 6.0  NEUTROABS 6.7  --   --   --  5.3 4.3  HGB 8.4* 8.1* 8.1* 9.5* 8.0* 8.3*  HCT 24.9* 23.9* 24.0* 28.5* 23.9* 25.2*  MCV 95.0  --   --   --  95.6 96.2  PLT 134*  --   --   --  142* 162   Basic Metabolic Panel: Recent Labs  Lab 05/27/18 2145 05/28/18 0552 05/29/18 0541  NA 138 138 138  K 3.3* 3.6 3.7  CL 105 105 105  CO2 26 25 24   GLUCOSE 119* 122* 109*  BUN 14 14 11   CREATININE 1.23 1.08 0.95  CALCIUM 7.9* 8.0* 7.4*   GFR: Estimated Creatinine Clearance: 67.3 mL/min (by C-G formula based on SCr of 0.95 mg/dL). Liver Function Tests: Recent Labs  Lab 05/28/18 0552  AST 49*  ALT 18  ALKPHOS 37*  BILITOT 1.2  PROT 5.8*  ALBUMIN 3.1*   No results for input(s): LIPASE, AMYLASE in the last 168 hours. No results for input(s): AMMONIA in the last 168 hours. Coagulation Profile: Recent Labs  Lab 05/28/18 0552 05/29/18 0541 05/30/18 0540  INR 2.29 1.38 1.33   Cardiac Enzymes: No results for input(s): CKTOTAL, CKMB, CKMBINDEX, TROPONINI in the last 168 hours. BNP (last 3 results) No results for input(s): PROBNP in the last 8760 hours. HbA1C: No results for input(s): HGBA1C in the last 72 hours. CBG: No results for input(s): GLUCAP in the last 168 hours. Lipid Profile: No results for input(s): CHOL, HDL, LDLCALC, TRIG, CHOLHDL, LDLDIRECT in the last 72 hours. Thyroid Function Tests: No results for input(s): TSH, T4TOTAL, FREET4, T3FREE, THYROIDAB in the last 72 hours. Anemia Panel: No results for input(s): VITAMINB12, FOLATE, FERRITIN, TIBC, IRON, RETICCTPCT in the last 72  hours.    Radiology Studies: I have reviewed all of the imaging during this hospital visit personally     Scheduled Meds: . enoxaparin (LOVENOX) injection  1 mg/kg Subcutaneous BID  . guaiFENesin-dextromethorphan  5 mL Oral Q6H  . ipratropium-albuterol  3 mL Nebulization TID  . levothyroxine  100 mcg Oral QAC breakfast  . pantoprazole  40 mg Oral Daily  . simvastatin  40 mg Oral QPM  . sodium chloride flush  3 mL Intravenous Q12H  . Warfarin - Pharmacist Dosing Inpatient   Does not apply q1800   Continuous Infusions: . sodium chloride       LOS: 3 days        Natsumi Whitsitt Annett Gulaaniel Nasiir Monts, MD [facility] Pager [phone]

## 2018-05-30 NOTE — H&P (View-Only) (Signed)
[facility] Consult  Referring Provider: Coralie KeensArrien, Mauricio Daniel, MD([facility]) Primary Care Physician:  Richardean Chimeraaniel, Terry G, MD Primary Gastroenterologist: Unassigned/GI in [place]  Reason for Consultation:  Melena, anemia  HPI: Eric Marks is a 84 y.o. male was [facility] on 05/27/2018 with complaints of anemia,Hemoglobin post transfusion and [facility] was 8.4, and has remained stable since admission .He was otherwise found to have a hemoglobin of 6.6 with an INR of 6.8 prior to transfer. Patient reports receiving 2 units of PRBC during transfer.  Patient states that for the last 2-3 days he has noticed that his stools have been unusually black, loose in consistency. Normally has 1 bowel movement today. He has been on warfarin for St. Jude mechanical valve placed in 1997. Patient reports having last colonoscopy performed about 6 years ago in detail, with removal of 3 colonic polyps. No prior endoscopy. Denies use of NSAIDs. Denies unintentional weight loss, loss of appetite, early satiety, bloating, difficulty swallowing or pain on swallowing.   Past Medical History:  Diagnosis Date  . Hyperlipidemia   . Hypothyroidism     Past Surgical History:  Procedure Laterality Date  . St jude mechanical valve      Prior to Admission medications   Medication Sig Start Date End Date Taking? Authorizing Provider  acetaminophen (TYLENOL) 325 MG tablet Take 650 mg by mouth every 6 (six) hours as needed for moderate pain or fever.    Yes [provider]  aspirin EC 81 MG tablet Take 81 mg by mouth every evening.   Yes [provider]  levothyroxine (SYNTHROID, LEVOTHROID) 100 MCG tablet TAKE 1 TABLET BY MOUTH DAILY FOR HYPOTHYROIDISM 04/14/18  Yes [provider]  simvastatin (ZOCOR) 40 MG tablet Take 40 mg by mouth every evening.  04/27/18  Yes [provider]  warfarin (COUMADIN) 6 MG  tablet Take 6 mg by mouth every evening.  04/27/18  Yes [provider]    Current Facility-Administered Medications  Medication Dose Route Frequency Provider Last Rate Last Dose  . 0.9 %  sodium chloride infusion  250 mL Intravenous PRN Arrien, York RamMauricio Daniel, MD      . acetaminophen (TYLENOL) tablet 650 mg  650 mg Oral Q6H PRN Arrien, York RamMauricio Daniel, MD       Or  . acetaminophen (TYLENOL) suppository 650 mg  650 mg Rectal Q6H PRN Arrien, York RamMauricio Daniel, MD      . guaiFENesin-dextromethorphan (ROBITUSSIN DM) 100-10 MG/5ML syrup 5 mL  5 mL Oral Q6H Arrien, York RamMauricio Daniel, MD   5 mL at 05/30/18 0533  . ipratropium-albuterol (DUONEB) 0.5-2.5 (3) MG/3ML nebulizer solution 3 mL  3 mL Nebulization Q2H PRN Arrien, York RamMauricio Daniel, MD      . ipratropium-albuterol (DUONEB) 0.5-2.5 (3) MG/3ML nebulizer solution 3 mL  3 mL Nebulization TID Arrien, York RamMauricio Daniel, MD   3 mL at 05/30/18 0829  . levothyroxine (SYNTHROID, LEVOTHROID) tablet 100 mcg  100 mcg Oral QAC breakfast Arrien, York RamMauricio Daniel, MD   100 mcg at 05/30/18 0532  . ondansetron (ZOFRAN) tablet 4 mg  4 mg Oral Q6H PRN Arrien, York RamMauricio Daniel, MD       Or  . ondansetron [facility](ZOFRAN) injection 4 mg  4 mg Intravenous Q6H PRN Arrien, York RamMauricio Daniel, MD      . pantoprazole (PROTONIX) EC tablet 40 mg  40 mg Oral BID Kerin SalenKarki, Miyoshi Ligas, MD      . simvastatin (ZOCOR) tablet 40 mg  40 mg  Oral QPM Arrien, York Ram, MD   40 mg at 05/29/18 1736  . sodium chloride flush (NS) 0.9 % injection 3 mL  3 mL Intravenous Q12H Arrien, York Ram, MD   3 mL at 05/29/18 2200  . sodium chloride flush (NS) 0.9 % injection 3 mL  3 mL Intravenous PRN Arrien, York Ram, MD        Allergies as of 05/27/2018 - Review Complete 05/27/2018  Allergen Reaction Noted  . No known allergies  05/27/2018    History reviewed. No pertinent family history.  Social History   Socioeconomic History  . Marital status: Married    Spouse name: Not on file  .  Number of children: Not on file  . Years of education: Not on file  . Highest education level: Not on file  Occupational History  . Not on file  Social Needs  . Financial resource strain: Not on file  . Food insecurity:    Worry: Not on file    Inability: Not on file  . Transportation needs:    Medical: Not on file    Non-medical: Not on file  Tobacco Use  . Smoking status: Former Smoker    Packs/day: 1.00    Types: Cigarettes    Last attempt to quit: 07/08/1996    Years since quitting: 21.9  . Smokeless tobacco: Never Used  Substance and Sexual Activity  . Alcohol use: Never    Frequency: Never  . Drug use: Never  . Sexual activity: Not Currently  Lifestyle  . Physical activity:    Days per week: Not on file    Minutes per session: Not on file  . Stress: Not on file  Relationships  . Social connections:    Talks on phone: Not on file    Gets together: Not on file    Attends religious service: Not on file    Active member of club or organization: Not on file    Attends meetings of clubs or organizations: Not on file    Relationship status: Not on file  . Intimate partner violence:    Fear of current or ex partner: Not on file    Emotionally abused: Not on file    Physically abused: Not on file    Forced sexual activity: Not on file  Other Topics Concern  . Not on file  Social History Narrative  . Not on file    Review of Systems: Positive for: GI: Described in detail in HPI.    Gen: Denies any fever, chills, rigors, night sweats, anorexia, fatigue, weakness, malaise, involuntary weight loss, and sleep disorder CV: Denies chest pain, angina, palpitations, syncope, orthopnea, PND, peripheral edema, and claudication. Resp: dyspnea, cough, sputum, wheezing, Denies coughing up blood. GU : Denies urinary burning, blood in urine, urinary frequency, urinary hesitancy, nocturnal urination, and urinary incontinence. MS: Denies joint pain or swelling.  Denies muscle  weakness, cramps, atrophy.  Derm: Denies rash, itching, oral ulcerations, hives, unhealing ulcers.  Psych: Denies depression, anxiety, memory loss, suicidal ideation, hallucinations,  and confusion. Heme: Denies bruising and enlarged lymph nodes. Neuro:  Denies any headaches, dizziness, paresthesias. Endo:  Denies any problems with DM, thyroid, adrenal function.  Physical Exam: Vital signs in last 24 hours: Temp:  [98.5 F (36.9 C)-98.9 F (37.2 C)] 98.5 F (36.9 C) (06/23 0513) Pulse Rate:  [73-87] 76 (06/23 0513) Resp:  [18-20] 19 (06/23 0513) BP: (111-121)/(49-66) 111/66 (06/23 0513) SpO2:  [96 %-98 %] 96 % (06/23 0829)  Last BM Date: 05/28/18  General:   Alert,  Well-developed, well-nourished, pleasant and cooperative in NAD Head:  Normocephalic and atraumatic. Eyes:  Sclera clear, no icterus.   Mild Pallor Ears:  Normal auditory acuity. Nose:  No deformity, discharge,  or lesions. Mouth:  No deformity or lesions.  Oropharynx pink & moist. Neck:  Supple; no masses or thyromegaly. Lungs:  Clear throughout to auscultation.   No wheezes, crackles, or rhonchi. No acute distress. Heart:  Midline chest scar, Regular rate and rhythm; clicks, no rubs,  or gallops. Extremities:  Without clubbing or edema. Neurologic:  Alert and  oriented x4;  grossly normal neurologically. Skin:  Intact without significant lesions or rashes. Psych:  Alert and cooperative. Normal mood and affect. Abdomen:  Soft, nontender and nondistended. No masses, hepatosplenomegaly or hernias noted. Normal bowel sounds, without guarding, and without rebound.         Lab Results: Recent Labs    05/27/18 2145  05/28/18 1221 05/29/18 0541 05/30/18 0540  WBC 7.9  --   --  6.9 6.0  HGB 8.4*   < > 9.5* 8.0* 8.3*  HCT 24.9*   < > 28.5* 23.9* 25.2*  PLT 134*  --   --  142* 162   < > = values in this interval not displayed.   BMET Recent Labs    05/27/18 2145 05/28/18 0552 05/29/18 0541  NA 138 138 138  K  3.3* 3.6 3.7  CL 105 105 105  CO2 26 25 24   GLUCOSE 119* 122* 109*  BUN 14 14 11   CREATININE 1.23 1.08 0.95  CALCIUM 7.9* 8.0* 7.4*   LFT Recent Labs    05/28/18 0552  PROT 5.8*  ALBUMIN 3.1*  AST 49*  ALT 18  ALKPHOS 37*  BILITOT 1.2   PT/INR Recent Labs    05/29/18 0541 05/30/18 0540  LABPROT 16.8* 16.4*  INR 1.38 1.33    Studies/Results: Dg Chest 2 View  Result Date: 05/29/2018 CLINICAL DATA:  Shortness of breath, cough, and congestion. EXAM: CHEST - 2 VIEW COMPARISON:  05/27/2018 FINDINGS: Sequelae of prior CABG and aortic valve replacement are again identified. The cardiomediastinal silhouette is unchanged with normal heart size. Left lower lobe airspace opacities are unchanged. There are small bilateral pleural effusions. No pneumothorax is identified. IMPRESSION: Unchanged left lower lobe airspace disease compatible with pneumonia. Small pleural effusions. Electronically Signed   By: Sebastian Ache M.D.   On: 05/29/2018 11:32    Impression: 1. Anemia, black stools, supratherapeutic INR on presentation,suspicious for upper GI bleeding Hemodynamically stable INR 1.33 today  2. Recent pneumonia, was treated with antibiotics, small pleural effusions and unchanged left lower lobe airspace disease noted on chest x-ray from 05/29/89  Plan: EGD in a.m. for diagnostic purpose also for clearance to restart Coumadin therafter depending upon findings. PPI twice a day. Discussed with Dr. Ella Jubilee, recommend IV heparin drip meanwhile, heparin to be discontinued at 3 AM in anticipation of EGD around 9 am. The risk and benefits of the procedure were discussed with the patient in detail, he understands and verbalizes consent    LOS: 3 days   Kerin Salen, MD  05/30/2018, 10:32 AM  Pager [phone] If no answer or after 5 PM call [phone]

## 2018-05-30 NOTE — Progress Notes (Signed)
CMT reported 5 beat run of VTACH. Pt up to the bathroom at the time. Asymptomatic. Attending notified. Will continue to monitor.

## 2018-05-30 NOTE — Progress Notes (Signed)
  Echocardiogram 2D Echocardiogram has been performed.  Eric SkeenVijay  [name] 05/30/2018, 11:06 AM

## 2018-05-30 NOTE — Consult Note (Signed)
[facility] Consult  Referring Provider: Arrien, Mauricio Daniel, MD([facility]) Primary Care Physician:  Daniel, Terry G, MD Primary Gastroenterologist: Unassigned/GI in [place]  Reason for Consultation:  Melena, anemia  HPI: Eric Marks is a 84 y.o. male was transferred from [facility] on 05/27/2018 with complaints of anemia,Hemoglobin post transfusion and transferred from [facility] was 8.4, and has remained stable since admission .He was otherwise found to have a hemoglobin of 6.6 with an INR of 6.8 prior to transfer. Patient reports receiving 2 units of PRBC during transfer.  Patient states that for the last 2-3 days he has noticed that his stools have been unusually black, loose in consistency. Normally has 1 bowel movement today. He has been on warfarin for St. Jude mechanical valve placed in 1997. Patient reports having last colonoscopy performed about 6 years ago in detail, with removal of 3 colonic polyps. No prior endoscopy. Denies use of NSAIDs. Denies unintentional weight loss, loss of appetite, early satiety, bloating, difficulty swallowing or pain on swallowing.   Past Medical History:  Diagnosis Date  . Hyperlipidemia   . Hypothyroidism     Past Surgical History:  Procedure Laterality Date  . St jude mechanical valve      Prior to Admission medications   Medication Sig Start Date End Date Taking? Authorizing Provider  acetaminophen (TYLENOL) 325 MG tablet Take 650 mg by mouth every 6 (six) hours as needed for moderate pain or fever.    Yes [provider]  aspirin EC 81 MG tablet Take 81 mg by mouth every evening.   Yes [provider]  levothyroxine (SYNTHROID, LEVOTHROID) 100 MCG tablet TAKE 1 TABLET BY MOUTH DAILY FOR HYPOTHYROIDISM 04/14/18  Yes [provider]  simvastatin (ZOCOR) 40 MG tablet Take 40 mg by mouth every evening.  04/27/18  Yes [provider]  warfarin (COUMADIN) 6 MG  tablet Take 6 mg by mouth every evening.  04/27/18  Yes [provider]    Current Facility-Administered Medications  Medication Dose Route Frequency Provider Last Rate Last Dose  . 0.9 %  sodium chloride infusion  250 mL Intravenous PRN Arrien, Mauricio Daniel, MD      . acetaminophen (TYLENOL) tablet 650 mg  650 mg Oral Q6H PRN Arrien, Mauricio Daniel, MD       Or  . acetaminophen (TYLENOL) suppository 650 mg  650 mg Rectal Q6H PRN Arrien, Mauricio Daniel, MD      . guaiFENesin-dextromethorphan (ROBITUSSIN DM) 100-10 MG/5ML syrup 5 mL  5 mL Oral Q6H Arrien, Mauricio Daniel, MD   5 mL at 05/30/18 0533  . ipratropium-albuterol (DUONEB) 0.5-2.5 (3) MG/3ML nebulizer solution 3 mL  3 mL Nebulization Q2H PRN Arrien, Mauricio Daniel, MD      . ipratropium-albuterol (DUONEB) 0.5-2.5 (3) MG/3ML nebulizer solution 3 mL  3 mL Nebulization TID Arrien, Mauricio Daniel, MD   3 mL at 05/30/18 0829  . levothyroxine (SYNTHROID, LEVOTHROID) tablet 100 mcg  100 mcg Oral QAC breakfast Arrien, Mauricio Daniel, MD   100 mcg at 05/30/18 0532  . ondansetron (ZOFRAN) tablet 4 mg  4 mg Oral Q6H PRN Arrien, Mauricio Daniel, MD       Or  . ondansetron (ZOFRAN) injection 4 mg  4 mg Intravenous Q6H PRN Arrien, Mauricio Daniel, MD      . pantoprazole (PROTONIX) EC tablet 40 mg  40 mg Oral BID Shaney Deckman, MD      . simvastatin (ZOCOR) tablet 40 mg  40 mg   Oral QPM Arrien, Mauricio Daniel, MD   40 mg at 05/29/18 1736  . sodium chloride flush (NS) 0.9 % injection 3 mL  3 mL Intravenous Q12H Arrien, Mauricio Daniel, MD   3 mL at 05/29/18 2200  . sodium chloride flush (NS) 0.9 % injection 3 mL  3 mL Intravenous PRN Arrien, Mauricio Daniel, MD        Allergies as of 05/27/2018 - Review Complete 05/27/2018  Allergen Reaction Noted  . No known allergies  05/27/2018    History reviewed. No pertinent family history.  Social History   Socioeconomic History  . Marital status: Married    Spouse name: Not on file  .  Number of children: Not on file  . Years of education: Not on file  . Highest education level: Not on file  Occupational History  . Not on file  Social Needs  . Financial resource strain: Not on file  . Food insecurity:    Worry: Not on file    Inability: Not on file  . Transportation needs:    Medical: Not on file    Non-medical: Not on file  Tobacco Use  . Smoking status: Former Smoker    Packs/day: 1.00    Types: Cigarettes    Last attempt to quit: 07/08/1996    Years since quitting: 21.9  . Smokeless tobacco: Never Used  Substance and Sexual Activity  . Alcohol use: Never    Frequency: Never  . Drug use: Never  . Sexual activity: Not Currently  Lifestyle  . Physical activity:    Days per week: Not on file    Minutes per session: Not on file  . Stress: Not on file  Relationships  . Social connections:    Talks on phone: Not on file    Gets together: Not on file    Attends religious service: Not on file    Active member of club or organization: Not on file    Attends meetings of clubs or organizations: Not on file    Relationship status: Not on file  . Intimate partner violence:    Fear of current or ex partner: Not on file    Emotionally abused: Not on file    Physically abused: Not on file    Forced sexual activity: Not on file  Other Topics Concern  . Not on file  Social History Narrative  . Not on file    Review of Systems: Positive for: GI: Described in detail in HPI.    Gen: Denies any fever, chills, rigors, night sweats, anorexia, fatigue, weakness, malaise, involuntary weight loss, and sleep disorder CV: Denies chest pain, angina, palpitations, syncope, orthopnea, PND, peripheral edema, and claudication. Resp: dyspnea, cough, sputum, wheezing, Denies coughing up blood. GU : Denies urinary burning, blood in urine, urinary frequency, urinary hesitancy, nocturnal urination, and urinary incontinence. MS: Denies joint pain or swelling.  Denies muscle  weakness, cramps, atrophy.  Derm: Denies rash, itching, oral ulcerations, hives, unhealing ulcers.  Psych: Denies depression, anxiety, memory loss, suicidal ideation, hallucinations,  and confusion. Heme: Denies bruising and enlarged lymph nodes. Neuro:  Denies any headaches, dizziness, paresthesias. Endo:  Denies any problems with DM, thyroid, adrenal function.  Physical Exam: Vital signs in last 24 hours: Temp:  [98.5 F (36.9 C)-98.9 F (37.2 C)] 98.5 F (36.9 C) (06/23 0513) Pulse Rate:  [73-87] 76 (06/23 0513) Resp:  [18-20] 19 (06/23 0513) BP: (111-121)/(49-66) 111/66 (06/23 0513) SpO2:  [96 %-98 %] 96 % (06/23 0829)   Last BM Date: 05/28/18  General:   Alert,  Well-developed, well-nourished, pleasant and cooperative in NAD Head:  Normocephalic and atraumatic. Eyes:  Sclera clear, no icterus.   Mild Pallor Ears:  Normal auditory acuity. Nose:  No deformity, discharge,  or lesions. Mouth:  No deformity or lesions.  Oropharynx pink & moist. Neck:  Supple; no masses or thyromegaly. Lungs:  Clear throughout to auscultation.   No wheezes, crackles, or rhonchi. No acute distress. Heart:  Midline chest scar, Regular rate and rhythm; clicks, no rubs,  or gallops. Extremities:  Without clubbing or edema. Neurologic:  Alert and  oriented x4;  grossly normal neurologically. Skin:  Intact without significant lesions or rashes. Psych:  Alert and cooperative. Normal mood and affect. Abdomen:  Soft, nontender and nondistended. No masses, hepatosplenomegaly or hernias noted. Normal bowel sounds, without guarding, and without rebound.         Lab Results: Recent Labs    05/27/18 2145  05/28/18 1221 05/29/18 0541 05/30/18 0540  WBC 7.9  --   --  6.9 6.0  HGB 8.4*   < > 9.5* 8.0* 8.3*  HCT 24.9*   < > 28.5* 23.9* 25.2*  PLT 134*  --   --  142* 162   < > = values in this interval not displayed.   BMET Recent Labs    05/27/18 2145 05/28/18 0552 05/29/18 0541  NA 138 138 138  K  3.3* 3.6 3.7  CL 105 105 105  CO2 26 25 24  GLUCOSE 119* 122* 109*  BUN 14 14 11  CREATININE 1.23 1.08 0.95  CALCIUM 7.9* 8.0* 7.4*   LFT Recent Labs    05/28/18 0552  PROT 5.8*  ALBUMIN 3.1*  AST 49*  ALT 18  ALKPHOS 37*  BILITOT 1.2   PT/INR Recent Labs    05/29/18 0541 05/30/18 0540  LABPROT 16.8* 16.4*  INR 1.38 1.33    Studies/Results: Dg Chest 2 View  Result Date: 05/29/2018 CLINICAL DATA:  Shortness of breath, cough, and congestion. EXAM: CHEST - 2 VIEW COMPARISON:  05/27/2018 FINDINGS: Sequelae of prior CABG and aortic valve replacement are again identified. The cardiomediastinal silhouette is unchanged with normal heart size. Left lower lobe airspace opacities are unchanged. There are small bilateral pleural effusions. No pneumothorax is identified. IMPRESSION: Unchanged left lower lobe airspace disease compatible with pneumonia. Small pleural effusions. Electronically Signed   By: Allen  Grady M.D.   On: 05/29/2018 11:32    Impression: 1. Anemia, black stools, supratherapeutic INR on presentation,suspicious for upper GI bleeding Hemodynamically stable INR 1.33 today  2. Recent pneumonia, was treated with antibiotics, small pleural effusions and unchanged left lower lobe airspace disease noted on chest x-ray from 05/29/89  Plan: EGD in a.m. for diagnostic purpose also for clearance to restart Coumadin therafter depending upon findings. PPI twice a day. Discussed with Dr. Arrien, recommend IV heparin drip meanwhile, heparin to be discontinued at 3 AM in anticipation of EGD around 9 am. The risk and benefits of the procedure were discussed with the patient in detail, he understands and verbalizes consent    LOS: 3 days   Marthe Dant, MD  05/30/2018, 10:32 AM  Pager [phone] If no answer or after 5 PM call [phone] 

## 2018-05-30 NOTE — Progress Notes (Addendum)
ANTICOAGULATION CONSULT NOTE  Pharmacy Consult for Warfarin + Lovenox >> IV heparin Indication: aortic mechanical valve replacement   Allergies  Allergen Reactions  . No Known Allergies     Patient Measurements: Height: 6\' 2"  (188 cm) Weight: 202 lb 6.1 oz (91.8 kg) IBW/kg (Calculated) : 82.2 Heparin dosing weight: TBW  Vital Signs: Temp: 98.5 F (36.9 C) (06/23 0513) Temp Source: Oral (06/23 0513) BP: 111/66 (06/23 0513) Pulse Rate: 76 (06/23 0513)  Labs: Recent Labs    05/27/18 2145  05/28/18 0552 05/28/18 1221 05/29/18 0541 05/30/18 0540  HGB 8.4*   < > 8.1* 9.5* 8.0* 8.3*  HCT 24.9*   < > 24.0* 28.5* 23.9* 25.2*  PLT 134*  --   --   --  142* 162  LABPROT  --   --  25.0*  --  16.8* 16.4*  INR  --   --  2.29  --  1.38 1.33  CREATININE 1.23  --  1.08  --  0.95  --    < > = values in this interval not displayed.    Estimated Creatinine Clearance: 67.3 mL/min (by C-G formula based on SCr of 0.95 mg/dL).  Medications:  Medications Prior to Admission  Medication Sig Dispense Refill Last Dose  . acetaminophen (TYLENOL) 325 MG tablet Take 650 mg by mouth every 6 (six) hours as needed for moderate pain or fever.    unknown  . aspirin EC 81 MG tablet Take 81 mg by mouth every evening.   05/26/2018 at Unknown time  . levothyroxine (SYNTHROID, LEVOTHROID) 100 MCG tablet TAKE 1 TABLET BY MOUTH DAILY FOR HYPOTHYROIDISM  3 05/27/2018 at Unknown time  . simvastatin (ZOCOR) 40 MG tablet Take 40 mg by mouth every evening.    05/26/2018 at Unknown time  . warfarin (COUMADIN) 6 MG tablet Take 6 mg by mouth every evening.    05/26/2018 at 1900   Scheduled:  . enoxaparin (LOVENOX) injection  1 mg/kg Subcutaneous BID  . guaiFENesin-dextromethorphan  5 mL Oral Q6H  . ipratropium-albuterol  3 mL Nebulization TID  . levothyroxine  100 mcg Oral QAC breakfast  . pantoprazole  40 mg Oral Daily  . simvastatin  40 mg Oral QPM  . sodium chloride flush  3 mL Intravenous Q12H  . Warfarin -  Pharmacist Dosing Inpatient   Does not apply q1800    Assessment: [age] [facility] with pneumonia, and anemia related to blood loss (GI and pulmonary). PMH includes chronic warfarin anticoagulation for aortic mechanical valve replacement. Since admission he has had no further bleeding and labs remain stable. Pharmacy is consulted to resume warfarin dosing.   PTA warfarin 6 mg daily, last dose on 6/19 at 1900. It is also reported that he had taken 5 days of Amoxicillin (not listed on med rec).  Admission INR reported to be 6.8 (not available in [facility])  Significant events:  6/20: Vit K 10mg  [facility]; RN reports [facility] and [facility]  6/22: MD requesting Lovenox per pharmacy while INR subtherapeutic  Today, 05/30/2018   INR remains subtherapeutic and slightly lower  CBC: Hgb back down after transfusion but has remained stable; plt improved to WNL  One dark stool yesterday; checking FOBT  Drug-drug interactions: broad spectrum antibiotics (Zosyn 6/20 >>) may prolong INR. Vitamin K dose given intramuscularly may suppress INR and may increase risk of hematoma formation.  Goal of Therapy:  INR 2-3 Monitor platelets by anticoagulation protocol: Yes   Plan:   Warfarin 6 mg PO x 1 tonight at 18:00; will increase dose now that subtherapeutic, but will be conservative given high admission INR on PTA regimen  Lovenox 90 mg SQ bid, to continue until INR back in therapeutic range  Daily PT/INR, SCr weekly while on Lovenox  Monitor for signs and symptoms of bleeding  Bernadene Person, PharmD, BCPS [phone] 05/30/2018, 9:44 AM   ---------------------------------------------------------------------------  ADDENDUM GI wanting to perform EGD tomorrow and switching patient to IV heparin infusion. AM Lovenox not given 6/23, INR remains subtherapeutic  Plan:  Discontinue  Lovenox/warfarin  Begin heparin infusion without bolus at 1450 units/hr; last Lovenox injection was ~12 hr ago  Check heparin level 8 hrs after start  Daily CBC and INR; heparin levels as needed  Monitor for new signs of bleeding or thrombosis  Bernadene Person, PharmD, BCPS [phone] 05/30/2018, 10:59 AM

## 2018-05-31 ENCOUNTER — Inpatient Hospital Stay (HOSPITAL_COMMUNITY): Payer: Medicare Other | Admitting: Anesthesiology

## 2018-05-31 ENCOUNTER — Encounter (HOSPITAL_COMMUNITY): Payer: Self-pay

## 2018-05-31 ENCOUNTER — Encounter (HOSPITAL_COMMUNITY)
Admission: AD | Disposition: A | Discharge status: Home / Self Care | Payer: Self-pay | Source: Other Acute Inpatient Hospital | Attending: Internal Medicine

## 2018-05-31 DIAGNOSIS — R0600 Dyspnea, unspecified: Secondary | ICD-10-CM

## 2018-05-31 HISTORY — PX: ESOPHAGOGASTRODUODENOSCOPY (EGD) WITH PROPOFOL: SHX5813

## 2018-05-31 HISTORY — PX: BIOPSY: SHX5522

## 2018-05-31 LAB — CBC
HCT: 24.8 % — ABNORMAL LOW (ref 39.0–52.0)
Hemoglobin: 7.9 g/dL — ABNORMAL LOW (ref 13.0–17.0)
MCH: 31.1 pg (ref 26.0–34.0)
MCHC: 31.9 g/dL (ref 30.0–36.0)
MCV: 97.6 fL (ref 78.0–100.0)
PLATELETS: 157 10*3/uL (ref 150–400)
RBC: 2.54 MIL/uL — ABNORMAL LOW (ref 4.22–5.81)
RDW: 15.4 % (ref 11.5–15.5)
WBC: 6.7 10*3/uL (ref 4.0–10.5)

## 2018-05-31 LAB — BASIC METABOLIC PANEL
Anion gap: 7 (ref 5–15)
BUN: 7 mg/dL (ref 6–20)
CALCIUM: 7.8 mg/dL — AB (ref 8.9–10.3)
CO2: 28 mmol/L (ref 22–32)
Chloride: 104 mmol/L (ref 101–111)
Creatinine, Ser: 0.73 mg/dL (ref 0.61–1.24)
GFR calc Af Amer: >60 mL/min (ref >60)
GFR calc non Af Amer: >60 mL/min (ref >60)
GLUCOSE: 112 mg/dL — AB (ref 65–99)
Potassium: 3.8 mmol/L (ref 3.5–5.1)
Sodium: 139 mmol/L (ref 135–145)

## 2018-05-31 LAB — PROTIME-INR
INR: 1.29
PROTHROMBIN TIME: 16 s — AB (ref 11.4–15.2)

## 2018-05-31 LAB — HEPARIN LEVEL (UNFRACTIONATED): Heparin Unfractionated: 0.19 IU/mL — ABNORMAL LOW (ref 0.30–0.70)

## 2018-05-31 LAB — PROCALCITONIN: Procalcitonin: 0.12 ng/mL

## 2018-05-31 LAB — MAGNESIUM: Magnesium: 2.1 mg/dL (ref 1.7–2.4)

## 2018-05-31 SURGERY — ESOPHAGOGASTRODUODENOSCOPY (EGD) WITH PROPOFOL
Anesthesia: Monitor Anesthesia Care

## 2018-05-31 MED ORDER — IPRATROPIUM-ALBUTEROL 0.5-2.5 (3) MG/3ML IN SOLN
3.0000 mL | Freq: Two times a day (BID) | RESPIRATORY_TRACT | Status: DC
  Administered 2018-05-31: 3 mL via RESPIRATORY_TRACT
  Filled 2018-05-31: qty 3

## 2018-05-31 MED ORDER — LACTATED RINGERS IV SOLN
INTRAVENOUS | Status: DC
  Administered 2018-05-31: 1000 mL via INTRAVENOUS

## 2018-05-31 MED ORDER — PROPOFOL 500 MG/50ML IV EMUL
INTRAVENOUS | Status: DC | PRN
  Administered 2018-05-31: 120 ug/kg/min via INTRAVENOUS

## 2018-05-31 MED ORDER — LIDOCAINE 2% (20 MG/ML) 5 ML SYRINGE
INTRAMUSCULAR | Status: DC | PRN
  Administered 2018-05-31: 100 mg via INTRAVENOUS

## 2018-05-31 MED ORDER — PROPOFOL 10 MG/ML IV BOLUS
INTRAVENOUS | Status: AC
  Filled 2018-05-31: qty 40

## 2018-05-31 MED ORDER — IPRATROPIUM-ALBUTEROL 0.5-2.5 (3) MG/3ML IN SOLN
3.0000 mL | Freq: Three times a day (TID) | RESPIRATORY_TRACT | Status: DC
  Administered 2018-06-01 – 2018-06-02 (×4): 3 mL via RESPIRATORY_TRACT
  Filled 2018-05-31 (×4): qty 3

## 2018-05-31 MED ORDER — PROPOFOL 10 MG/ML IV BOLUS
INTRAVENOUS | Status: DC | PRN
  Administered 2018-05-31 (×2): 20 mg via INTRAVENOUS

## 2018-05-31 MED ORDER — HEPARIN (PORCINE) IN NACL 100-0.45 UNIT/ML-% IJ SOLN
1750.0000 [IU]/h | INTRAMUSCULAR | Status: AC
Start: 2018-05-31 — End: 2018-06-02
  Administered 2018-06-01 (×2): 1750 [IU]/h via INTRAVENOUS
  Filled 2018-05-31 (×4): qty 250

## 2018-05-31 MED ORDER — WARFARIN SODIUM 5 MG PO TABS: 5.0000 mg | ORAL_TABLET | Freq: Once | ORAL | Status: DC

## 2018-05-31 MED ORDER — HEPARIN (PORCINE) IN NACL 100-0.45 UNIT/ML-% IJ SOLN
1550.0000 [IU]/h | INTRAMUSCULAR | Status: DC
  Administered 2018-05-31: 1550 [IU]/h via INTRAVENOUS
  Filled 2018-05-31 (×2): qty 250

## 2018-05-31 MED ORDER — WARFARIN - PHARMACIST DOSING INPATIENT: Freq: Every day | Status: DC

## 2018-05-31 SURGICAL SUPPLY — 15 items

## 2018-05-31 NOTE — Progress Notes (Signed)
PROGRESS NOTE    Eric Marks  ZOX:[id number] DOB: 05-27-1931 DOA: 05/27/2018 PCP: [name], MD    Brief Narrative:  84 year old malewith past medical history for aortic mechanical valve replacement and dyslipidemia,who presented with acute symptomatic anemia, for the last 2 weeks he has been experiencing coughing, wheezing and dyspnea. Reported hemoptysis and bright red blood per rectum. He has been anticoagulated with warfarin, he did receive antibiotic therapy as an outpatient. On his initial physical examination temperature 98.4, blood pressure 119/96, heart rate 76, respiratory rate 17, oxygen saturation 100% on 2 L nasal cannula. Mild pale, lungs with decreased breath sounds at bases bilaterally with rales at the left base, heart S1-S2 present, rhythmic, mechanical click/S2, abdomen soft nontender, no lower extremity edema. Out-of-hospital INR 6.6, hemoglobin 6.8.Reported chest CT with left lower lobe infiltrate.  Patient wiasadmitted to the hospital with a working diagnosis of acute symptomatic anemia due to vitamin K antagonist coagulopathy, complicated by left lower lobe pneumonia with hemoptysis and hematochezia.    Assessment & Plan:   Active Problems:   Pneumonia   Coagulopathy (HCC)   Acute posthemorrhagic anemia   Valvular heart disease   History of aortic valve replacement   1. Acute symptomatic anemia, related to acute blood loss ( possible GI ), in the setting of vitamin K antagonist coagulopathy sp one unit prbc transfusion. Patient underwent upper endoscopy today with gastritis with no acute bleeding, will resume heparin drip and warfarin. Resume feedings and continue proton pump inhibitors bid.   2. Aortic mechanical valve. INR dow to 1,29, no active GI bleeding will resume warfarin and continue bridging with heparin drip. No further episodes of Vt, K is 3,8 and Mg at 2,1. Echocardiogram with normal LV systolic function, aortic mechanical valve  functioning well.   3. Left lower lobe pneumonia (present on admission). Follow up procalcitonin trending down, will continue to hold on antibiotic therapy. Continue as needed supplemental 02 per Jud. No clinical signs of volume overload.   4. Acute lower GI bleed/ upper. resume anticoagulation, no active bleeding on EGD, will follow cell count in am.   5. Dyslipidemia. On simvastatin.  6. Hypothyroid. On levothyroxine.   DVT prophylaxis:warfarin Code Status:full Family Communication:no family at the bedside  Disposition Plan:waiting INR to be therapeutic before discharge.   Consultants:    Procedures:    Antimicrobials:     Subjective: No further melena, no abdominal pain, no nausea or vomiting, no chest pain or dyspnea. Has EGD this am with no signs of active bleeding.   Objective: Vitals:   05/31/18 0937 05/31/18 1034 05/31/18 1040 05/31/18 1045  BP: (!) 144/55 (!) 105/52 114/66   Pulse: 77 85 93   Resp: 20 (!) 25 (!) 21   Temp: 98.7 F (37.1 C) 98.6 F (37 C)    TempSrc: Oral Oral    SpO2: 94% 96% 98% 97%  Weight: 91.6 kg (202 lb)     Height: 6\' 2"  (1.88 m)       Intake/Output Summary (Last 24 hours) at 05/31/2018 1311 Last data filed at 05/31/2018 1034 Gross per 24 hour  Intake 687.8 ml  Output 2600 ml  Net -1912.2 ml   Filed Weights   05/27/18 1704 05/31/18 0937  Weight: 91.8 kg (202 lb 6.1 oz) 91.6 kg (202 lb)    Examination:   General: not in pain or dyspnea   Neurology: Awake and alert, non focal  E ENT: continue with mild pallor, no icterus, oral mucosa moist Cardiovascular:  No JVD. S1-S2 present, rhythmic, no gallops, rubs. No lower extremity edema. Positive murmur at the right upper sternal border 3.6 with no radiation, mechanical click S2.  Pulmonary: positive breath sounds at left base, adequate air movement, no wheezing, rhonchi or rales. Gastrointestinal. Abdomen protuberant with no organomegaly, non tender, no  rebound or guarding Skin. No rashes Musculoskeletal: no joint deformities     Data Reviewed: I have personally reviewed following labs and imaging studies  CBC: Recent Labs  Lab 05/27/18 2145  05/28/18 0552 05/28/18 1221 05/29/18 0541 05/30/18 0540 05/31/18 0536  WBC 7.9  --   --   --  6.9 6.0 6.7  NEUTROABS 6.7  --   --   --  5.3 4.3  --   HGB 8.4*   < > 8.1* 9.5* 8.0* 8.3* 7.9*  HCT 24.9*   < > 24.0* 28.5* 23.9* 25.2* 24.8*  MCV 95.0  --   --   --  95.6 96.2 97.6  PLT 134*  --   --   --  142* 162 157   < > = values in this interval not displayed.   Basic Metabolic Panel: Recent Labs  Lab 05/27/18 2145 05/28/18 0552 05/29/18 0541 05/31/18 0536  NA 138 138 138 139  K 3.3* 3.6 3.7 3.8  CL 105 105 105 104  CO2 26 25 24 28   GLUCOSE 119* 122* 109* 112*  BUN 14 14 11 7   CREATININE 1.23 1.08 0.95 0.73  CALCIUM 7.9* 8.0* 7.4* 7.8*  MG  --   --   --  2.1   GFR: Estimated Creatinine Clearance: 79.9 mL/min (by C-G formula based on SCr of 0.73 mg/dL). Liver Function Tests: Recent Labs  Lab 05/28/18 0552  AST 49*  ALT 18  ALKPHOS 37*  BILITOT 1.2  PROT 5.8*  ALBUMIN 3.1*   No results for input(s): LIPASE, AMYLASE in the last 168 hours. No results for input(s): AMMONIA in the last 168 hours. Coagulation Profile: Recent Labs  Lab 05/28/18 0552 05/29/18 0541 05/30/18 0540 05/31/18 0536  INR 2.29 1.38 1.33 1.29   Cardiac Enzymes: No results for input(s): CKTOTAL, CKMB, CKMBINDEX, TROPONINI in the last 168 hours. BNP (last 3 results) No results for input(s): PROBNP in the last 8760 hours. HbA1C: No results for input(s): HGBA1C in the last 72 hours. CBG: No results for input(s): GLUCAP in the last 168 hours. Lipid Profile: No results for input(s): CHOL, HDL, LDLCALC, TRIG, CHOLHDL, LDLDIRECT in the last 72 hours. Thyroid Function Tests: No results for input(s): TSH, T4TOTAL, FREET4, T3FREE, THYROIDAB in the last 72 hours. Anemia Panel: No results for  input(s): VITAMINB12, FOLATE, FERRITIN, TIBC, IRON, RETICCTPCT in the last 72 hours.    Radiology Studies: I have reviewed all of the imaging during this hospital visit personally     Scheduled Meds: . guaiFENesin-dextromethorphan  5 mL Oral Q6H  . ipratropium-albuterol  3 mL Nebulization BID  . levothyroxine  100 mcg Oral QAC breakfast  . pantoprazole  40 mg Oral BID  . simvastatin  40 mg Oral QPM  . sodium chloride flush  3 mL Intravenous Q12H   Continuous Infusions: . sodium chloride    . heparin 1,550 Units/hr (05/31/18 1121)     LOS: 4 days        Mauricio Annett Gulaaniel Arrien, MD [facility] Pager [phone]

## 2018-05-31 NOTE — Anesthesia Postprocedure Evaluation (Signed)
Anesthesia Post Note  Patient: Eric Marks  Procedure(s) Performed: ESOPHAGOGASTRODUODENOSCOPY (EGD) WITH PROPOFOL (N/A ) BIOPSY     Patient location during evaluation: Endoscopy Anesthesia Type: MAC Level of consciousness: awake and alert Pain management: pain level controlled Vital Signs Assessment: post-procedure vital signs reviewed and stable Respiratory status: spontaneous breathing, nonlabored ventilation and respiratory function stable Cardiovascular status: stable and blood pressure returned to baseline Postop Assessment: no apparent nausea or vomiting Anesthetic complications: no    Last Vitals:  Vitals:   05/31/18 1034 05/31/18 1040  BP: (!) 105/52 114/66  Pulse: 85 93  Resp: (!) 25 (!) 21  Temp: 37 C   SpO2: 96% 98%    Last Pain:  Vitals:   05/31/18 1034  TempSrc: Oral  PainSc: 0-No pain                 Lynda Rainwater

## 2018-05-31 NOTE — Transfer of Care (Signed)
Immediate Anesthesia Transfer of Care Note  Patient: Eric Marks  Procedure(s) Performed: ESOPHAGOGASTRODUODENOSCOPY (EGD) WITH PROPOFOL (N/A ) BIOPSY  Patient Location: Endoscopy Unit  Anesthesia Type:MAC  Level of Consciousness: awake  Airway & Oxygen Therapy: Patient Spontanous Breathing and Patient connected to nasal cannula oxygen  Post-op Assessment: Report given to RN and Post -op Vital signs reviewed and stable  Post vital signs: Reviewed and stable  Last Vitals:  Vitals Value Taken Time  BP    Temp    Pulse 85 05/31/2018 10:34 AM  Resp 25 05/31/2018 10:34 AM  SpO2 96 % 05/31/2018 10:34 AM  Vitals shown include unvalidated device data.  Last Pain:  Vitals:   05/31/18 0937  TempSrc: Oral  PainSc: 0-No pain         Complications: No apparent anesthesia complications

## 2018-05-31 NOTE — Brief Op Note (Signed)
05/27/2018 - 05/31/2018  10:34 AM  PATIENT:  Eric Marks  84 y.o. male  PRE-OPERATIVE DIAGNOSIS:  Melena,anemia  POST-OPERATIVE DIAGNOSIS:  gastritis/hiatel hernia  PROCEDURE:  Procedure(s): ESOPHAGOGASTRODUODENOSCOPY (EGD) WITH PROPOFOL (N/A) BIOPSY  SURGEON:  Surgeon(s) and Role:    * Natane Heward, MD - Primary  Findings ----------- - EGD showed gastritis, medium-size hiatal hernia. No evidence of active bleeding.  Recommendations ----------------------- - ok to restart heparin drip from GI standpoint - start full liquid diet, advance as tolerated -  check CBC in the morning. - if continues to drop in hemoglobin,consider colonoscopy for further evaluation. - GI will follow  Kathi DerParag Yamila Cragin MD, FACP 05/31/2018, 10:36 AM  Contact #  [phone]

## 2018-05-31 NOTE — Interval H&P Note (Signed)
History and Physical Interval Note:  05/31/2018 9:29 AM  Eric Marks  has presented today for surgery, with the diagnosis of Melena,anemia  The various methods of treatment have been discussed with the patient and family. After consideration of risks, benefits and other options for treatment, the patient has consented to  Procedure(s): ESOPHAGOGASTRODUODENOSCOPY (EGD) WITH PROPOFOL (N/A) as a surgical intervention .  The patient's history has been reviewed, patient examined, no change in status, stable for surgery.  I have reviewed the patient's chart and labs.  Questions were answered to the patient's satisfaction.    Risks (bleeding, infection, bowel perforation that could require surgery, sedation-related changes in cardiopulmonary systems), benefits (identification and possible treatment of source of symptoms, exclusion of certain causes of symptoms), and alternatives (watchful waiting, radiographic imaging studies, empiric medical treatment)  were explained to patient in detail and patient wishes to proceed.  Vernona Peake

## 2018-05-31 NOTE — Progress Notes (Addendum)
ANTICOAGULATION CONSULT NOTE  Pharmacy Consult IV heparin Indication: aortic mechanical valve replacement (while warfarin on hold)  Allergies  Allergen Reactions  . No Known Allergies     Patient Measurements: Height: 6\' 2"  (188 cm) Weight: 202 lb (91.6 kg) IBW/kg (Calculated) : 82.2 Heparin dosing weight: total body weight  Vital Signs: Temp: 98.6 F (37 C) (06/24 1034) Temp Source: Oral (06/24 1034) BP: 114/66 (06/24 1040) Pulse Rate: 93 (06/24 1040)  Labs: Recent Labs    05/29/18 0541 05/30/18 0540 05/30/18 1939 05/31/18 0536  HGB 8.0* 8.3*  --  7.9*  HCT 23.9* 25.2*  --  24.8*  PLT 142* 162  --  157  LABPROT 16.8* 16.4*  --  16.0*  INR 1.38 1.33  --  1.29  HEPARINUNFRC  --   --  0.26*  --   CREATININE 0.95  --   --  0.73    Estimated Creatinine Clearance: 79.9 mL/min (by C-G formula based on SCr of 0.73 mg/dL).  Assessment: [age] [facility] with pneumonia, and anemia related to blood loss (GI and pulmonary). PMH includes chronic warfarin anticoagulation for aortic mechanical valve replacement. Since admission he has had no further bleeding and labs remain stable. Pharmacy consulted to resume warfarin dosing on 6/21 (now on hold for procedures).   PTA warfarin 6 mg daily, last dose PTA on 6/19 at 1900. It is also reported that he had taken 5 days of Amoxicillin (not listed on med rec).  Admission INR reported to be 6.8 ([facility])  Significant events:  6/20: Vit K 10mg  [facility]; RN [facility] and [facility]  6/22: MD requesting Lovenox per pharmacy while INR subtherapeutic  6/23: Lovenox + warfarin transitioned to IV heparin in anticipation of EGD 6/24  Today, 05/31/2018   CBC: Hgb decreased to 7.9, Pltc WNL  INR 1.29  IV heparin drip held at 0300 for EGD. EGD revealed gastritis, hiatal hernia, no evidence of active bleeding.  Pharmacy consulted to resume heparin drip post-procedure per GI  Goal of Therapy:  Heparin level 0.3-0.7 units/mL INR 2.5-3.5 (confirmed INR goal with [facility] office in [place], patient sees Dr. Reuel Boomaniel) Monitor platelets by anticoagulation protocol: Yes   Plan:  Resume heparin drip at previous rate of 1550 units/hr   Check heparin level 8 hours after heparin drip resumed  Daily heparin level and CBC  Monitor closely for s/sx of bleeding  F/u plans on when/if safe to resume warfarin   Greer PickerelJigna Sherrel Shafer, PharmD, BCPS Pager: [phone] 05/31/2018 11:12 AM   Addendum: Pharmacy consulted to resume warfarin per [facility].    Warfarin 5mg  PO x 1 today  Daily PT/INR   Greer PickerelJigna Marifer Hurd, PharmD, BCPS Pager: [phone] 05/31/2018 3:31 PM   Addendum:  Per GI's note, plan is to re-check CBC in AM and if Hgb continues to drop, consider colonoscopy for further evaluation. Spoke to Dr. Ella JubileeArrien to clarify resuming warfarin with possibility of further procedures. Per MD, do not resume warfarin today. MD will re-evaluate in AM.    Greer PickerelJigna Jannelly Bergren, PharmD, BCPS Pager: [phone] 05/31/2018 3:53 PM

## 2018-05-31 NOTE — Progress Notes (Signed)
ANTICOAGULATION CONSULT NOTE  Pharmacy Consult IV heparin Indication: aortic mechanical valve replacement (while warfarin on hold)  Allergies  Allergen Reactions  . No Known Allergies     Patient Measurements: Height: 6\' 2"  (188 cm) Weight: 202 lb (91.6 kg) IBW/kg (Calculated) : 82.2 Heparin dosing weight: total body weight  Vital Signs: Temp: 97.7 F (36.5 C) (06/24 1402) Temp Source: Oral (06/24 1402) BP: 133/64 (06/24 1402) Pulse Rate: 73 (06/24 1402)  Labs: Recent Labs    05/29/18 0541 05/30/18 0540 05/30/18 1939 05/31/18 0536 05/31/18 1926  HGB 8.0* 8.3*  --  7.9*  --   HCT 23.9* 25.2*  --  24.8*  --   PLT 142* 162  --  157  --   LABPROT 16.8* 16.4*  --  16.0*  --   INR 1.38 1.33  --  1.29  --   HEPARINUNFRC  --   --  0.26*  --  0.19*  CREATININE 0.95  --   --  0.73  --     Estimated Creatinine Clearance: 79.9 mL/min (by C-G formula based on SCr of 0.73 mg/dL).  Assessment: [age] [facility] with pneumonia, and anemia related to blood loss (GI and pulmonary). PMH includes chronic warfarin anticoagulation for aortic mechanical valve replacement. Since admission he has had no further bleeding and labs remain stable. Pharmacy consulted to resume warfarin dosing on 6/21 (now on hold for procedures).   PTA warfarin 6 mg daily, last dose PTA on 6/19 at 1900. It is also reported that he had taken 5 days of Amoxicillin (not listed on med rec).  Admission IN[facility] reported to be 6[facility]8 [facility]  Significant events:  6/20: Vit K 10mg  [facility]; RN reports [facility] and [facility]  6/22: MD requesting Lovenox per pharmacy while INR subtherapeutic  6/23: Lovenox + warfarin transitioned to IV heparin in anticipation of EGD 6/24  Today, 05/31/2018   CBC: Hgb decreased to 7.9, Pltc WNL  INR 1.29  IV heparin drip held at 0300 for EGD. EGD revealed  gastritis, hiatal hernia, no evidence of active bleeding. Pharmacy consulted to resume heparin drip post-procedure per GI  2nd shift update  Heparin level = 0.19 with heparin infusing @ 1550 units/hr  No complications of therapy  No interruption of infusion per RN  Goal of Therapy:  Heparin level 0.3-0.7 units/mL INR 2.5-3.5 (confirmed INR goal with [facility] office in [place], patient sees Dr. Reuel Boomaniel) Monitor platelets by anticoagulation protocol: Yes   Plan:  Increase heparin drip to 1750 units/hr   Check heparin level 8 hours after rate increase  Daily heparin level and CBC  Monitor closely for s/sx of bleeding  F/u plans on when/if safe to resume warfarin  Terrilee FilesLeann Brennen Camper, PharmD 05/31/18 @ 20:11

## 2018-05-31 NOTE — Anesthesia Preprocedure Evaluation (Signed)
Anesthesia Evaluation  Patient identified by MRN, date of birth, ID band Patient awake    Reviewed: Allergy & Precautions, NPO status , Patient's Chart, lab work & pertinent test results  Airway Mallampati: II  TM Distance: >3 FB Neck ROM: Full    Dental no notable dental hx.    Pulmonary neg pulmonary ROS, former smoker,    Pulmonary exam normal breath sounds clear to auscultation       Cardiovascular negative cardio ROS Normal cardiovascular exam Rhythm:Regular Rate:Normal     Neuro/Psych negative neurological ROS  negative psych ROS   GI/Hepatic negative GI ROS, Neg liver ROS,   Endo/Other  negative endocrine ROS  Renal/GU negative Renal ROS  negative genitourinary   Musculoskeletal negative musculoskeletal ROS (+)   Abdominal   Peds negative pediatric ROS (+)  Hematology negative hematology ROS (+) anemia ,   Anesthesia Other Findings   Reproductive/Obstetrics negative OB ROS                             Anesthesia Physical Anesthesia Plan  ASA: II  Anesthesia Plan: MAC   Post-op Pain Management:    Induction: Intravenous  PONV Risk Score and Plan: 1 and Treatment may vary due to age or medical condition  Airway Management Planned: Nasal Cannula  Additional Equipment:   Intra-op Plan:   Post-operative Plan:   Informed Consent: I have reviewed the patients History and Physical, chart, labs and discussed the procedure including the risks, benefits and alternatives for the proposed anesthesia with the patient or authorized representative who has indicated his/her understanding and acceptance.   Dental advisory given  Plan Discussed with: CRNA  Anesthesia Plan Comments:         Anesthesia Quick Evaluation

## 2018-05-31 NOTE — Op Note (Signed)
[facility] Patient Name: Eric Marks Procedure Date: 05/31/2018 MRN: [id number] Attending MD: Kathi Der , MD Date of Birth: 08/27/1931 CSN: [id number] Age: 84 Admit Type: Inpatient Procedure:                Upper GI endoscopy Indications:              Melena Providers:                Kathi Der, MD, Dwain Sarna, RN, Beryle Beams, Technician, Leroy Libman, CRNA Referring MD:              Medicines:                Sedation Administered by an Anesthesia Professional Complications:            No immediate complications. Estimated Blood Loss:     Estimated blood loss was minimal. Procedure:                Pre-Anesthesia Assessment:                           - Prior to the procedure, a History and Physical                            was performed, and patient medications and                            allergies were reviewed. The patient's tolerance of                            previous anesthesia was also reviewed. The risks                            and benefits of the procedure and the sedation                            options and risks were discussed with the patient.                            All questions were answered, and informed consent                            was obtained. Prior Anticoagulants: The patient                            last took Coumadin (warfarin) 3 days prior to the                            procedure and last took heparin on the day of the                            procedure. ASA Grade Assessment: II - A patient  with mild systemic disease. After reviewing the                            risks and benefits, the patient was deemed in                            satisfactory condition to undergo the procedure.                           After obtaining informed consent, the endoscope was                            passed under direct vision. Throughout the               procedure, the patient's blood pressure, pulse, and                            oxygen saturations were monitored continuously. The                            [id number]) scope was introduced through the                            mouth, and advanced to the second part of duodenum.                            The upper GI endoscopy was accomplished without                            difficulty. The patient tolerated the procedure                            well. Scope In: Scope Out: Findings:      The Z-line was regular and was found 39 cm from the incisors.      A medium-sized hiatal hernia was present.      Abnormal motility was noted in the esophagus.      Diffuse mild inflammation characterized by erythema and friability was       found in the gastric body. Biopsies were taken with a cold forceps for       histology.      The cardia and gastric fundus were normal on retroflexion.      The duodenal bulb, first portion of the duodenum and second portion of       the duodenum were normal. Impression:               - Z-line regular, 39 cm from the incisors.                           - Medium-sized hiatal hernia.                           - Abnormal esophageal motility.                           - Gastritis. Biopsied.                           -  Normal duodenal bulb, first portion of the                            duodenum and second portion of the duodenum. Moderate Sedation:      Moderate (conscious) sedation was personally administered by an       anesthesia professional. The following parameters were monitored: oxygen       saturation, heart rate, blood pressure, and response to care. Recommendation:           - Return patient to hospital ward for ongoing care.                           - Full liquid diet.                           - Continue present medications.                           - Await pathology results. Procedure Code(s):        --- Professional ---                            [phone], Esophagogastroduodenoscopy, flexible,                            transoral; with biopsy, single or multiple Diagnosis Code(s):        --- Professional ---                           K44.9, Diaphragmatic hernia without obstruction or                            gangrene                           K22.4, Dyskinesia of esophagus                           K29.70, Gastritis, unspecified, without bleeding                           K92.1, Melena (includes Hematochezia) CPT copyright 2017 [employer]. All rights reserved. The codes documented in this report are preliminary and upon coder review may  be revised to meet current compliance requirements. Kathi DerParag Raia Amico, MD Kathi DerParag Jessaca Philippi, MD 05/31/2018 10:33:51 AM Number of Addenda: 0

## 2018-05-31 NOTE — Plan of Care (Signed)
  Problem: Clinical Measurements: Goal: Ability to maintain clinical measurements within normal limits will improve Outcome: Progressing   Problem: Clinical Measurements: Goal: Will remain free from infection Outcome: Progressing   Problem: Clinical Measurements: Goal: Diagnostic test results will improve Outcome: Progressing   Problem: Clinical Measurements: Goal: Respiratory complications will improve Outcome: Progressing   Problem: Clinical Measurements: Goal: Cardiovascular complication will be avoided Outcome: Progressing   Problem: Activity: Goal: Risk for activity intolerance will decrease Outcome: Progressing   Problem: Nutrition: Goal: Adequate nutrition will be maintained Outcome: Progressing   

## 2018-06-01 ENCOUNTER — Encounter (HOSPITAL_COMMUNITY): Payer: Self-pay | Admitting: Gastroenterology

## 2018-06-01 LAB — HEPARIN LEVEL (UNFRACTIONATED)
HEPARIN UNFRACTIONATED: 0.53 [IU]/mL (ref 0.30–0.70)
HEPARIN UNFRACTIONATED: 0.52 [IU]/mL (ref 0.30–0.70)

## 2018-06-01 LAB — CBC
HEMATOCRIT: 26.8 % — AB (ref 39.0–52.0)
Hemoglobin: 8.5 g/dL — ABNORMAL LOW (ref 13.0–17.0)
MCH: 30.9 pg (ref 26.0–34.0)
MCHC: 31.7 g/dL (ref 30.0–36.0)
MCV: 97.5 fL (ref 78.0–100.0)
PLATELETS: 193 10*3/uL (ref 150–400)
RBC: 2.75 MIL/uL — AB (ref 4.22–5.81)
RDW: 15.2 % (ref 11.5–15.5)
WBC: 7 10*3/uL (ref 4.0–10.5)

## 2018-06-01 LAB — PROCALCITONIN

## 2018-06-01 LAB — PROTIME-INR
INR: 1.24
Prothrombin Time: 15.5 seconds — ABNORMAL HIGH (ref 11.4–15.2)

## 2018-06-01 MED ORDER — WARFARIN - PHARMACIST DOSING INPATIENT: Freq: Every day | Status: DC

## 2018-06-01 MED ORDER — WARFARIN SODIUM 6 MG PO TABS
6.0000 mg | ORAL_TABLET | Freq: Once | ORAL | Status: AC
  Administered 2018-06-01: 6 mg via ORAL
  Filled 2018-06-01: qty 1

## 2018-06-01 NOTE — Progress Notes (Signed)
[facility] Progress Note  Tyna JakschJimmy Mayor 84 y.o. [birth year]  CC:   Melena    Subjective: feeling better.denied any further bleeding episodes. No bowel movement today. Remains on heparin drip.  ROS : negative for chest pain and shortness of breath.   Objective: Vital signs in last 24 hours: Vitals:   06/01/18 0455 06/01/18 0624  BP: (!) 143/62   Pulse: 70   Resp: (!) 21   Temp: 98.5 F (36.9 C)   SpO2: 96% 97%    Physical Exam:  Gen. Alert/oriented 3. Not in acute distress Abdomen. Soft, nontender, nondistended, bowel sounds present. Lungs/no respiratory distress. Clear to auscultate Immediately. No edema  Lab Results: Recent Labs    05/31/18 0536  NA 139  K 3.8  CL 104  CO2 28  GLUCOSE 112*  BUN 7  CREATININE 0.73  CALCIUM 7.8*  MG 2.1   No results for input(s): AST, ALT, ALKPHOS, BILITOT, PROT, ALBUMIN in the last 72 hours. Recent Labs    05/30/18 0540 05/31/18 0536 06/01/18 0551  WBC 6.0 6.7 7.0  NEUTROABS 4.3  --   --   HGB 8.3* 7.9* 8.5*  HCT 25.2* 24.8* 26.8*  MCV 96.2 97.6 97.5  PLT 162 157 193   Recent Labs    05/31/18 0536 06/01/18 0551  LABPROT 16.0* 15.5*  INR 1.29 1.24      Assessment/Plan: - melena in setting of supratherapeutic INR. Resolved now. EGD yesterday showed mild gastritis and medium-sized hiatal hernia. No evidence of active bleeding. - acute blood loss anemia. Hemoglobin stable. - aortic mechanical valve replacement.  Recommendations -------------------------- - Okay to resume Coumadin from GI standpoint. - No further inpatient GI workup planned. - No need for colonoscopy  because he is 84 years old without any active GI symptomsand there is no evidence of ongoing bleeding. - GI will sign off. Call us back if needed   Kathi DerParag Carra Brindley MD, FACP 06/01/2018, 10:19 AM  Contact #  [phone]

## 2018-06-01 NOTE — Progress Notes (Addendum)
ANTICOAGULATION CONSULT NOTE  Pharmacy Consult IV heparin Indication: aortic mechanical valve replacement (while warfarin on hold)  Allergies  Allergen Reactions  . No Known Allergies     Patient Measurements: Height: 6\' 2"  (188 cm) Weight: 202 lb (91.6 kg) IBW/kg (Calculated) : 82.2 Heparin dosing weight: total body weight  Vital Signs: Temp: 98.5 F (36.9 C) (06/25 0455) BP: 143/62 (06/25 0455) Pulse Rate: 70 (06/25 0455)  Labs: Recent Labs    05/30/18 0540 05/30/18 1939 05/31/18 0536 05/31/18 1926 06/01/18 0551  HGB 8.3*  --  7.9*  --  8.5*  HCT 25.2*  --  24.8*  --  26.8*  PLT 162  --  157  --  193  LABPROT 16.4*  --  16.0*  --  15.5*  INR 1.33  --  1.29  --  1.24  HEPARINUNFRC  --  0.26*  --  0.19* 0.53  CREATININE  --   --  0.73  --   --     Estimated Creatinine Clearance: 79.9 mL/min (by C-G formula based on SCr of 0.73 mg/dL).  Assessment: [age] [facility] with pneumonia, and anemia related to blood loss (GI and pulmonary). PMH includes chronic warfarin anticoagulation for aortic mechanical valve replacement. Since admission he has had no further bleeding and labs remain stable. Pharmacy consulted to resume warfarin dosing on 6/21 (subsequently held for procedures).   PTA warfarin 6 mg daily, last dose PTA on 6/19 at 1900. It is also reported that he had taken 5 days of Amoxicillin (not listed on med rec).  Ad[facility] to [facility] 6[facility]8 [facility]  Significant events:  6/20: Vit K 10mg  [facility]; RN reports [facility]BC [facility] and one unit PRBC [facility]  6/22: MD requesting Lovenox per pharmacy while INR subtherapeutic  6/23: Lovenox + warfarin transitioned to IV heparin in anticipation of EGD 6/24  No active bleeding on EGD, heparin resumed post-procedure on 6/24  Today, 06/01/2018   CBC: Hgb increased to 8.5, Pltc WNL  Heparin level this AM =  0.53 units/mL, therapeutic on heparin infusion at 1750 units/hr  No bleeding reported per nursing  No infusion issues noted per nursing   Goal of Therapy:  Heparin level 0.3-0.7 units/mL INR 2.5-3.5 (confirmed INR goal with [facility] [place], patient sees Dr. Reuel Boomaniel), once warfarin resumed Monitor platelets by anticoagulation protocol: Yes   Plan:  Continue heparin drip at 1750 units/hr   Check heparin level at 1400  Daily heparin level and CBC  Monitor closely for s/sx of bleeding  F/u plans on when safe to resume warfarin   Greer PickerelJigna Mychael Smock, PharmD, BCPS Pager: [phone] 06/01/2018 7:24 AM   Addendum: Pharmacy consulted to resume warfarin.    INR = 1.24  1351 heparin level = 0.52 units/mL, remains therapeutic  No bleeding or infusion issues per nursing  Plan:  Warfarin 6mg  PO x 1 today  Daily PT/INR  Continue heparin infusion at 1750 units/hr  Daily CBC and heparin level  Monitor closely for bleeding   Greer PickerelJigna Yosef Krogh, PharmD, BCPS Pager: [phone] 06/01/2018 2:22 PM

## 2018-06-01 NOTE — Progress Notes (Addendum)
PROGRESS NOTE    Eric Marks  ZOX:[id number] DOB: 05/04/1931 DOA: 6/20/20[name]19 PCP: Eric Chimeraaniel, Terry G, MD    Brief Narrative:  84 year old malewith past medical history for aortic mechanical valve replacement and dyslipidemia,who presented with acute symptomatic anemia. For the last 2 weeks prior to admission prior to admission he has been experiencing coughing, wheezing and dyspnea. Reported hemoptysis and bright red blood per rectum. He was on anticoagulated with warfarin, and he did receive antibiotic therapy as an outpatient for a diagnosis of community acquired pneumonia. On his initial physical examination temperature 98.4, blood pressure 119/96, heart rate 76, respiratory rate 17, oxygen saturation 100% on 2 L nasal cannula. Mild pallor, lungs with decreased breath sounds at bases bilaterally with rales at the left base, heart S1-S2 present, rhythmic, mechanical click/S2, abdomen soft nontender, no lower extremity edema. Out-of-hospital INR 6.6, hemoglobin 6.8.Reported chest CT with left lower lobe infiltrate.  Patient wasadmitted to the hospital with a working diagnosis of acute symptomatic anemia due to vitamin K antagonist coagulopathy, complicated by left lower lobe pneumonia with hemoptysis and hematochezia   Assessment & Plan:   Active Problems:   Pneumonia   Coagulopathy (HCC)   Acute posthemorrhagic anemia   Valvular heart disease   History of aortic valve replacement  1. Acute symptomatic anemia, related to acute blood loss (possibleGI ), in the setting of vitamin K antagonist coagulopathysp one unit prbc transfusion. EGD with gastritis but no acute bleeding, continue pantoprazole po, tolerating well po diet, his hb has remained stable at 8,5 with hct at 26,8. Will continue to follow cell count. No further GI workup for now.   2. Aortic mechanical valve.patient [facility], his INR continue to be suptherapeutic  at 1,24, will continue bridging with heparin drip, resumed warfarin. If continue slow to recover INR will consider outpatient bridging with therapeutic enoxaparin.   3. Left lower lobe pneumonia (present on admission). Patient oxygenating well, now on room air oxygenation is 93%. Continue to hold on antibiotic therapy, patient had a complete antibiotic course as outpatient. Procalcitonin down to less than 0.10 today.    4. Dyslipidemia. Continue withsimvastatin.  6. Hypothyroid.Contrinue with levothyroxine.   DVT prophylaxis:warfarin Code Status:full Family Communication:no family at the bedside Disposition Plan:waiting INR to be therapeutic before discharge.   Consultants:    Procedures:    Antimicrobials:     Subjective: Patient is feeling better, no melena or hemoptysis, no nausea or vomiting, no chest pain or dyspnea.   Objective: Vitals:   06/01/18 0455 06/01/18 0624 06/01/18 1415 06/01/18 1451  BP: (!) 143/62  (!) 116/57   Pulse: 70  71 72  Resp: (!) 21   17  Temp: 98.5 F (36.9 C)  98.2 F (36.8 C)   TempSrc:   Oral   SpO2: 96% 97% 93% 93%  Weight:      Height:        Intake/Output Summary (Last 24 hours) at 06/01/2018 1527 Last data filed at 06/01/2018 1434 Gross per 24 hour  Intake 742.21 ml  Output 2225 ml  Net -1482.79 ml   Filed Weights   05/27/18 1704 05/31/18 0937  Weight: 91.8 kg (202 lb 6.1 oz) 91.6 kg (202 lb)    Examination:   General: Not in pain or dyspnea,  Neurology: Awake and alert, non focal  E ENT: mild pallor, no icterus, oral mucosa moist Cardiovascular: No JVD. S1-S2 present, rhythmic, no gallops, rubs, or murmurs. No lower extremity  edema. Pulmonary: vesicular breath sounds bilaterally, adequate air movement, no wheezing, or rhonchi, left base rales. Gastrointestinal. Abdomen with no organomegaly, non tender, no rebound or guarding Skin. No rashes Musculoskeletal: no joint deformities     Data  Reviewed: I have personally reviewed following labs and imaging studies  CBC: Recent Labs  Lab 05/27/18 2145  05/28/18 1221 05/29/18 0541 05/30/18 0540 05/31/18 0536 06/01/18 0551  WBC 7.9  --   --  6.9 6.0 6.7 7.0  NEUTROABS 6.7  --   --  5.3 4.3  --   --   HGB 8.4*   < > 9.5* 8.0* 8.3* 7.9* 8.5*  HCT 24.9*   < > 28.5* 23.9* 25.2* 24.8* 26.8*  MCV 95.0  --   --  95.6 96.2 97.6 97.5  PLT 134*  --   --  142* 162 157 193   < > = values in this interval not displayed.   Basic Metabolic Panel: Recent Labs  Lab 05/27/18 2145 05/28/18 0552 05/29/18 0541 05/31/18 0536  NA 138 138 138 139  K 3.3* 3.6 3.7 3.8  CL 105 105 105 104  CO2 26 25 24 28   GLUCOSE 119* 122* 109* 112*  BUN 14 14 11 7   CREATININE 1.23 1.08 0.95 0.73  CALCIUM 7.9* 8.0* 7.4* 7.8*  MG  --   --   --  2.1   GFR: Estimated Creatinine Clearance: 79.9 mL/min (by C-Marks formula based on SCr of 0.73 mg/dL). Liver Function Tests: Recent Labs  Lab 05/28/18 0552  AST 49*  ALT 18  ALKPHOS 37*  BILITOT 1.2  PROT 5.8*  ALBUMIN 3.1*   No results for input(s): LIPASE, AMYLASE in the last 168 hours. No results for input(s): AMMONIA in the last 168 hours. Coagulation Profile: Recent Labs  Lab 05/28/18 0552 05/29/18 0541 05/30/18 0540 05/31/18 0536 06/01/18 0551  INR 2.29 1.38 1.33 1.29 1.24   Cardiac Enzymes: No results for input(s): CKTOTAL, CKMB, CKMBINDEX, TROPONINI in the last 168 hours. BNP (last 3 results) No results for input(s): PROBNP in the last 8760 hours. HbA1C: No results for input(s): HGBA1C in the last 72 hours. CBG: No results for input(s): GLUCAP in the last 168 hours. Lipid Profile: No results for input(s): CHOL, HDL, LDLCALC, TRIG, CHOLHDL, LDLDIRECT in the last 72 hours. Thyroid Function Tests: No results for input(s): TSH, T4TOTAL, FREET4, T3FREE, THYROIDAB in the last 72 hours. Anemia Panel: No results for input(s): VITAMINB12, FOLATE, FERRITIN, TIBC, IRON, RETICCTPCT in the last  72 hours.    Radiology Studies: I have reviewed all of the imaging during this hospital visit personally     Scheduled Meds: . guaiFENesin-dextromethorphan  5 mL Oral Q6H  . ipratropium-albuterol  3 mL Nebulization TID  . levothyroxine  100 mcg Oral QAC breakfast  . pantoprazole  40 mg Oral BID  . simvastatin  40 mg Oral QPM  . sodium chloride flush  3 mL Intravenous Q12H  . warfarin  6 mg Oral ONCE-1800  . Warfarin - Pharmacist Dosing Inpatient   Does not apply q1800   Continuous Infusions: . sodium chloride    . heparin 1,750 Units/hr (06/01/18 0452)     LOS: 5 days        Anabela Crayton Annett Gula, MD [facility] Pager [phone]

## 2018-06-01 NOTE — Care Management Important Message (Signed)
Important Message  Patient Details  Name: Eric Marks MRN: [id number] Date of Birth: 1931-08-02   Medicare Important Message Given:  Yes    Caren MacadamFuller, Sharvil Hoey 06/01/2018, 12:19 PMImportant Message  Patient Details  Name: Eric Marks MRN: [id number] Date of Birth: 1931-08-02   Medicare Important Message Given:  Yes    Caren MacadamFuller, Shela Esses 06/01/2018, 12:19 PM

## 2018-06-02 DIAGNOSIS — D62 Acute posthemorrhagic anemia: Secondary | ICD-10-CM

## 2018-06-02 DIAGNOSIS — Z952 Presence of prosthetic heart valve: Secondary | ICD-10-CM

## 2018-06-02 DIAGNOSIS — K2901 Acute gastritis with bleeding: Secondary | ICD-10-CM

## 2018-06-02 LAB — PROTIME-INR
INR: 1.3
PROTHROMBIN TIME: 16.1 s — AB (ref 11.4–15.2)

## 2018-06-02 LAB — BASIC METABOLIC PANEL
Anion gap: 8 (ref 5–15)
BUN: 10 mg/dL (ref 8–23)
CHLORIDE: 103 mmol/L (ref 98–111)
CO2: 29 mmol/L (ref 22–32)
Calcium: 8.3 mg/dL — ABNORMAL LOW (ref 8.9–10.3)
Creatinine, Ser: 0.84 mg/dL (ref 0.61–1.24)
GFR calc Af Amer: >60 mL/min (ref >60)
GFR calc non Af Amer: >60 mL/min (ref >60)
GLUCOSE: 111 mg/dL — AB (ref 70–99)
POTASSIUM: 3.9 mmol/L (ref 3.5–5.1)
Sodium: 140 mmol/L (ref 135–145)

## 2018-06-02 LAB — CBC
HCT: 26.3 % — ABNORMAL LOW (ref 39.0–52.0)
Hemoglobin: 8.4 g/dL — ABNORMAL LOW (ref 13.0–17.0)
MCH: 30.9 pg (ref 26.0–34.0)
MCHC: 31.9 g/dL (ref 30.0–36.0)
MCV: 96.7 fL (ref 78.0–100.0)
Platelets: 205 10*3/uL (ref 150–400)
RBC: 2.72 MIL/uL — ABNORMAL LOW (ref 4.22–5.81)
RDW: 15.1 % (ref 11.5–15.5)
WBC: 5.9 10*3/uL (ref 4.0–10.5)

## 2018-06-02 LAB — HEPARIN LEVEL (UNFRACTIONATED): HEPARIN UNFRACTIONATED: 0.64 [IU]/mL (ref 0.30–0.70)

## 2018-06-02 MED ORDER — ENOXAPARIN SODIUM 100 MG/ML ~~LOC~~ SOLN
90.0000 mg | Freq: Two times a day (BID) | SUBCUTANEOUS | Status: DC
  Administered 2018-06-02: 90 mg via SUBCUTANEOUS
  Filled 2018-06-02: qty 1

## 2018-06-02 MED ORDER — ENOXAPARIN SODIUM 100 MG/ML ~~LOC~~ SOLN: 90.0000 mg | Freq: Two times a day (BID) | SUBCUTANEOUS | 0 refills | Status: AC

## 2018-06-02 MED ORDER — WARFARIN SODIUM 6 MG PO TABS: 6.0000 mg | ORAL_TABLET | Freq: Once | ORAL | 0 refills | Status: AC

## 2018-06-02 MED ORDER — DIPHENHYDRAMINE HCL 50 MG PO CAPS: 50.0000 mg | ORAL_CAPSULE | Freq: Once | ORAL | Status: DC

## 2018-06-02 MED ORDER — WARFARIN SODIUM 6 MG PO TABS
6.0000 mg | ORAL_TABLET | Freq: Once | ORAL | Status: AC
  Administered 2018-06-02: 6 mg via ORAL
  Filled 2018-06-02: qty 1

## 2018-06-02 MED ORDER — PANTOPRAZOLE SODIUM 40 MG PO TBEC: 40.0000 mg | DELAYED_RELEASE_TABLET | Freq: Two times a day (BID) | ORAL | 0 refills | Status: AC

## 2018-06-02 NOTE — Discharge Instructions (Signed)
Needs INR checked every 2 days starting on Friday ie. Friday, Sunday and Tues. Once INR is > 2.5 please stop Lovenox.    You were cared for by a hospitalist during your hospital stay. If you have any questions about your discharge medications or the care you received while you were in the hospital after you are discharged, you can call the unit and asked to speak with the hospitalist on call if the hospitalist that took care of you is not available. Once you are discharged, your primary care physician will handle any further medical issues. Please note that NO REFILLS for any discharge medications will be authorized once you are discharged, as it is imperative that you return to your primary care physician (or establish a relationship with a primary care physician if you do not have one) for your aftercare needs so that they can reassess your need for medications and monitor your lab values.  Please take all your medications with you for your next visit with your Primary MD. Please ask your Primary MD to get all Hospital records sent to his/her office. Please request your Primary MD to go over all hospital test results at the follow up.    If you experience worsening of your admission symptoms, develop shortness of breath, chest pain, suicidal or homicidal thoughts or a life threatening emergency, you must seek medical attention immediately by calling 911 or calling your MD.   Bonita QuinYou must read the complete instructions/literature along with all the possible adverse reactions/side effects for all the medicines you take including new medications that have been prescribed to you. Take new medicines after you have completely understood and accpet all the possible adverse reactions/side effects.    Do not drive when taking pain medications or sedatives.     Do not take more than prescribed Pain, Sleep and Anxiety Medications   If you have smoked or chewed Tobacco in the last 2 yrs please stop. Stop any  regular alcohol  and or recreational drug use.   Wear Seat belts while driving.

## 2018-06-02 NOTE — Discharge Summary (Signed)
Physician Discharge Summary  Eric Marks ZOX:[id number] DOB: December 31, 1930 DOA: 05/27/2018  PCP: Eric Chimera, MD  Admit date: 05/27/2018 Discharge date: 06/02/2018  Admitted From: home Disposition:  Home  Recommendations for Outpatient Follow-up:  1. Cont Lovenox until INR > 2.5  Home Health:  none  Equipment/Devices:  none    Discharge Condition:  stable   CODE STATUS:  Full code   Consultations:  GI    Discharge Diagnoses:  Active Problems:   Pneumonia   Coagulopathy (HCC)   Acute posthemorrhagic anemia   Valvular heart disease   History of aortic valve replacement      Brief Summary: 84 year old malewith past medical history for aortic mechanical valve replacement and dyslipidemia,who presented with acute symptomatic anemia. For the last 2 weeks prior to admission prior to admission he has been experiencing coughing, wheezing and dyspnea. Reported hemoptysis and bright red blood per rectum. He was on anticoagulated with warfarin, and he did receive antibiotic therapy as an outpatient for a diagnosis of community acquired pneumonia. On his initial physical examination temperature 98.4, blood pressure 119/96, heart rate 76, respiratory rate 17, oxygen saturation 100% on 2 L nasal cannula. Mild pallor, lungs with decreased breath sounds at bases bilaterally with rales at the left base, heart S1-S2 present, rhythmic, mechanical click/S2, abdomen soft nontender, no lower extremity edema. Out-of-hospital INR 6.6, hemoglobin 6.8.Reported chest CT with left lower lobe infiltrate.  Patient wasadmitted to the hospital with a working diagnosis of acute symptomatic anemia due to vitamin K antagonist coagulopathy, complicated by left lower lobe pneumonia with hemoptysis and hematochezia     Hospital Course:   Acute symptomatic anemia, related to acute blood loss (possibleGI ), in the setting of Coumadin - INR was 2.29- Vit Kgiven to [facility] - s/p 1  U PRBC as well  EGD shows gastritis but no acute bleeding, continue pantoprazole po BID -  his hb has remained stable at 8.5 with hct at 26.8.     Aortic mechanical valve.patient did received vitamin K at [facility] before transfer, bridged with Heparin- he would like to go home and is comfortable doing Lovenox- - INR today is 1.30 -  he will have his PT/ONR levels checked at [facility] Q 2 days (including Sunday which the nurse has confirmed) and continue Lovenox until INR is > 2.5    Left lower lobe pneumonia (present on admission CXR)?  Patient oxygenating well and was not given any more antibiotics as it was treated as outpt    Dyslipidemia.Continue withsimvastatin.   Hypothyroid.Contrinue withlevothyroxine.   Discharge Exam: Vitals:   06/02/18 0503 06/02/18 0801  BP: 132/63   Pulse: 75   Resp: 20   Temp: 98.2 F (36.8 C)   SpO2: 94% 90%   Vitals:   06/01/18 1946 06/01/18 2050 06/02/18 0503 06/02/18 0801  BP:  (!) 118/58 132/63   Pulse:  79 75   Resp:  (!) 23 20   Temp:  98.2 F (36.8 C) 98.2 F (36.8 C)   TempSrc:      SpO2: 92% (!) 89% 94% 90%  Weight:      Height:        General: Pt is alert, awake, not in acute distress Cardiovascular: RRR, S1/S2 +, no rubs, no gallops Respiratory: CTA bilaterally, no wheezing, no rhonchi Abdominal: Soft, NT, ND, bowel sounds + Extremities: no edema, no cyanosis   Discharge Instructions  Discharge Instructions    Diet - low sodium  heart healthy   Complete by:  As directed    Increase activity slowly   Complete by:  As directed      Allergies as of 06/02/2018      Reactions   No Known Allergies       Medication List    STOP taking these medications   aspirin EC 81 MG tablet     TAKE these medications   acetaminophen 325 MG tablet Commonly known as:  TYLENOL Take 650 mg by mouth every 6 (six) hours as needed for moderate pain or fever.   enoxaparin 100 MG/ML injection Commonly  known as:  LOVENOX Inject 0.9 mLs (90 mg total) into the skin 2 (two) times daily.   levothyroxine 100 MCG tablet Commonly known as:  SYNTHROID, LEVOTHROID TAKE 1 TABLET BY MOUTH DAILY FOR HYPOTHYROIDISM   pantoprazole 40 MG tablet Commonly known as:  PROTONIX Take 1 tablet (40 mg total) by mouth 2 (two) times daily.   simvastatin 40 MG tablet Commonly known as:  ZOCOR Take 40 mg by mouth every evening.   warfarin 6 MG tablet Commonly known as:  COUMADIN Take 1 tablet (6 mg total) by mouth once for 1 dose. What changed:  when to take this      Follow-up Information    Eric Chimeraaniel, Terry G, MD. Schedule an appointment as soon as possible for a visit in 1 week(s).   Specialty:  Family Medicine Contact information: [address] [place] [address] [phone]          Allergies  Allergen Reactions  . No Known Allergies      Procedures/Studies:  EGD 05/31/18  2 ECHO 05/30/18 Study Conclusions  - Left ventricle: The cavity size was normal. There was moderate   concentric hypertrophy. Systolic function was vigorous. The   estimated ejection fraction was in the range of 65% to 70%. Wall   motion was normal; there were no regional wall motion   abnormalities. Doppler parameters are consistent with abnormal   left ventricular relaxation (grade 1 diastolic dysfunction). - Aortic valve: A mechanical prosthesis was present and functioning   normally. The gradient was mildly elevated across the mechanical   aortic valve.   There was mild regurgitation. Valve area (VTI): 1.56 cm^2. Valve   area (Vmax): 1.43 cm^2. Valve area (Vmean): 1.39 cm^2. - Ascending aorta: The ascending aorta was severely dilated. - Mitral valve: Severely calcified annulus. Valve area by   continuity equation (using LVOT flow): 2.21 cm^2. - Left atrium: The atrium was mildly to moderately dilated. - Right ventricle: The cavity size was mildly dilated. - Right atrium: The atrium was mildly to moderately  dilated. - Pulmonary arteries: PA peak pressure: 51 mm Hg (S).   Dg Chest 2 View  Result Date: 05/29/2018 CLINICAL DATA:  Shortness of breath, cough, and congestion. EXAM: CHEST - 2 VIEW COMPARISON:  05/27/2018 FINDINGS: Sequelae of prior CABG and aortic valve replacement are again identified. The cardiomediastinal silhouette is unchanged with normal heart size. Left lower lobe airspace opacities are unchanged. There are small bilateral pleural effusions. No pneumothorax is identified. IMPRESSION: Unchanged left lower lobe airspace disease compatible with pneumonia. Small pleural effusions. Electronically Signed   By: Sebastian AcheAllen  Grady M.D.   On: 05/29/2018 11:32   Dg Chest 2 View  Result Date: 05/27/2018 CLINICAL DATA:  Dyspnea.  Previous smoker. EXAM: CHEST - 2 VIEW COMPARISON:  CT chest 05/27/2018 FINDINGS: Postoperative changes in the mediastinum. Shallow inspiration with elevation of the  left hemidiaphragm. Airspace consolidation in the left lung base consistent with pneumonia. Mild blunting of left costophrenic angle may indicate a small pleural effusion. No pneumothorax. Calcification of the aorta. IMPRESSION: Left lower lobe airspace consolidation with probable small left pleural effusion likely representing pneumonia. Electronically Signed   By: Burman Nieves M.D.   On: 05/27/2018 21:05     The results of significant diagnostics from this hospitalization (including imaging, microbiology, ancillary and laboratory) are listed below for reference.     Microbiology: No results found for this or any previous visit (from the past 240 hour(s)).   Labs: BNP (last 3 results) No results for input(s): BNP in the last 8760 hours. Basic Metabolic Panel: Recent Labs  Lab 05/27/18 2145 05/28/18 0552 05/29/18 0541 05/31/18 0536 06/02/18 0609  NA 138 138 138 139 140  K 3.3* 3.6 3.7 3.8 3.9  CL 105 105 105 104 103  CO2 26 25 24 28 29   GLUCOSE 119* 122* 109* 112* 111*  BUN 14 14 11 7 10    CREATININE 1.23 1.08 0.95 0.73 0.84  CALCIUM 7.9* 8.0* 7.4* 7.8* 8.3*  MG  --   --   --  2.1  --    Liver Function Tests: Recent Labs  Lab 05/28/18 0552  AST 49*  ALT 18  ALKPHOS 37*  BILITOT 1.2  PROT 5.8*  ALBUMIN 3.1*   No results for input(s): LIPASE, AMYLASE in the last 168 hours. No results for input(s): AMMONIA in the last 168 hours. CBC: Recent Labs  Lab 05/27/18 2145  05/29/18 0541 05/30/18 0540 05/31/18 0536 06/01/18 0551 06/02/18 0609  WBC 7.9  --  6.9 6.0 6.7 7.0 5.9  NEUTROABS 6.7  --  5.3 4.3  --   --   --   HGB 8.4*   < > 8.0* 8.3* 7.9* 8.5* 8.4*  HCT 24.9*   < > 23.9* 25.2* 24.8* 26.8* 26.3*  MCV 95.0  --  95.6 96.2 97.6 97.5 96.7  PLT 134*  --  142* 162 157 193 205   < > = values in this interval not displayed.   Cardiac Enzymes: No results for input(s): CKTOTAL, CKMB, CKMBINDEX, TROPONINI in the last 168 hours. BNP: Invalid input(s): POCBNP CBG: No results for input(s): GLUCAP in the last 168 hours. D-Dimer No results for input(s): DDIMER in the last 72 hours. Hgb A1c No results for input(s): HGBA1C in the last 72 hours. Lipid Profile No results for input(s): CHOL, HDL, LDLCALC, TRIG, CHOLHDL, LDLDIRECT in the last 72 hours. Thyroid function studies No results for input(s): TSH, T4TOTAL, T3FREE, THYROIDAB in the last 72 hours.  Invalid input(s): FREET3 Anemia work up No results for input(s): VITAMINB12, FOLATE, FERRITIN, TIBC, IRON, RETICCTPCT in the last 72 hours. Urinalysis No results found for: COLORURINE, APPEARANCEUR, LABSPEC, PHURINE, GLUCOSEU, HGBUR, BILIRUBINUR, KETONESUR, PROTEINUR, UROBILINOGEN, NITRITE, LEUKOCYTESUR Sepsis Labs Invalid input(s): PROCALCITONIN,  WBC,  LACTICIDVEN Microbiology No results found for this or any previous visit (from the past 240 hour(s)).   Time coordinating discharge in minutes: 65  SIGNED:   Calvert Cantor, MD  [facility] 06/02/2018, 1:43 PM Pager   If 7PM-7AM, please contact  night-coverage [email or web address] Password [email or web address]

## 2018-06-02 NOTE — Progress Notes (Signed)
Pts IV removed with a clean and dry dressing intact. Pt and family educated about lovenox and how to administer.

## 2018-06-02 NOTE — Progress Notes (Signed)
ANTICOAGULATION CONSULT NOTE  Pharmacy Consult: Warfarin, IV Heparin to Lovenox for bridging Indication: aortic mechanical valve replacement  Allergies  Allergen Reactions  . No Known Allergies    Patient Measurements: Height: 6\' 2"  (188 cm) Weight: 202 lb (91.6 kg) IBW/kg (Calculated) : 82.2 Heparin dosing weight: total body weight  Vital Signs: Temp: 98.2 F (36.8 C) (06/26 0503) BP: 132/63 (06/26 0503) Pulse Rate: 75 (06/26 0503)  Labs: Recent Labs    05/31/18 0536  06/01/18 0551 06/01/18 1351 06/02/18 0609  HGB 7.9*  --  8.5*  --  8.4*  HCT 24.8*  --  26.8*  --  26.3*  PLT 157  --  193  --  205  LABPROT 16.0*  --  15.5*  --  16.1*  INR 1.29  --  1.24  --  1.30  HEPARINUNFRC  --    < > 0.53 0.52 0.64  CREATININE 0.73  --   --   --  0.84   < > = values in this interval not displayed.    Estimated Creatinine Clearance: 76.1 mL/min (by C-G formula based on SCr of 0.84 mg/dL).  Assessment: [age] [facility] with pneumonia, and anemia related to blood loss (GI and pulmonary). PMH includes chronic warfarin anticoagulation for aortic mechanical valve replacement. Since admission he has had no further bleeding and labs remain stable. Pharmacy consulted to resume warfarin dosing on 6/21 (subsequently held for procedures).   PTA warfarin 6 mg daily, last dose PTA on 6/19 at 1900. It is also reported that he had taken 5 days of Amoxicillin (not listed on med rec).  Admission INR reported to be 6.8 ([facility]  Significant events:  6/20: Vit K 10mg  [facility]; RN reports he [facility] and [facility] unit PRBC [facility]  6/22: MD requesting Lovenox per pharmacy while INR subtherapeutic  6/23: Lovenox + warfarin transitioned to IV heparin in anticipation of EGD 6/24  No active bleeding on EGD, heparin resumed post-procedure on 6/24  6/26 plan discharge on Lovenox  bridge  Today, 06/02/2018   CBC: Hgb 8.4, Pltc WNL  Heparin level this AM = 0.64 units/mL, therapeutic on heparin infusion at 1750 units/hr  INR 1.3 after 6mg  Warfarin 6/25 pm  Goal of Therapy:  INR 2.5-3.5 (confirmed INR goal with [facility] office in [place], patient sees Dr. Reuel Boomaniel), once warfarin resumed Monitor platelets by anticoagulation protocol: Yes   Plan:  Discontinue Heparin at 1100  Lovenox 90mg  SQ q12 beginning at 1200  Patient instruction on Lovenox administration  Warfarin 6mg  today before discharge  Outpatient INR checks q2days beginning 6/28  Otho BellowsGreen, Kameko Hukill L PharmD Pager [phone] 06/02/2018, 11:17 AM

## 2018-06-03 DIAGNOSIS — I2510 Atherosclerotic heart disease of native coronary artery without angina pectoris: Secondary | ICD-10-CM | POA: Diagnosis not present

## 2018-06-03 DIAGNOSIS — Z952 Presence of prosthetic heart valve: Secondary | ICD-10-CM | POA: Diagnosis not present

## 2018-06-07 DIAGNOSIS — I2510 Atherosclerotic heart disease of native coronary artery without angina pectoris: Secondary | ICD-10-CM | POA: Diagnosis not present

## 2018-06-07 DIAGNOSIS — D649 Anemia, unspecified: Secondary | ICD-10-CM | POA: Diagnosis not present

## 2018-06-07 DIAGNOSIS — Z952 Presence of prosthetic heart valve: Secondary | ICD-10-CM | POA: Diagnosis not present

## 2018-06-14 DIAGNOSIS — Z952 Presence of prosthetic heart valve: Secondary | ICD-10-CM | POA: Diagnosis not present

## 2018-06-18 DIAGNOSIS — Z952 Presence of prosthetic heart valve: Secondary | ICD-10-CM | POA: Diagnosis not present

## 2018-06-22 DIAGNOSIS — I2510 Atherosclerotic heart disease of native coronary artery without angina pectoris: Secondary | ICD-10-CM | POA: Diagnosis not present

## 2018-06-22 DIAGNOSIS — Z952 Presence of prosthetic heart valve: Secondary | ICD-10-CM | POA: Diagnosis not present

## 2018-06-24 DIAGNOSIS — Z952 Presence of prosthetic heart valve: Secondary | ICD-10-CM | POA: Diagnosis not present

## 2018-06-28 DIAGNOSIS — Z952 Presence of prosthetic heart valve: Secondary | ICD-10-CM | POA: Diagnosis not present

## 2018-06-29 DIAGNOSIS — R4020 Unspecified coma: Secondary | ICD-10-CM | POA: Diagnosis not present

## 2018-07-08 DIAGNOSIS — 419620001 Death: Secondary | SNOMED CT | POA: Diagnosis not present

## 2018-07-08 DEATH — deceased

## 2020-05-18 IMAGING — DX DG CHEST 2V
2 series · 2 of 2 positions shown · non-contrast
Comparison: CT chest 05/27/2018

CLINICAL DATA: Dyspnea.  Previous smoker.

EXAM:
CHEST - 2 VIEW

[chest pa]
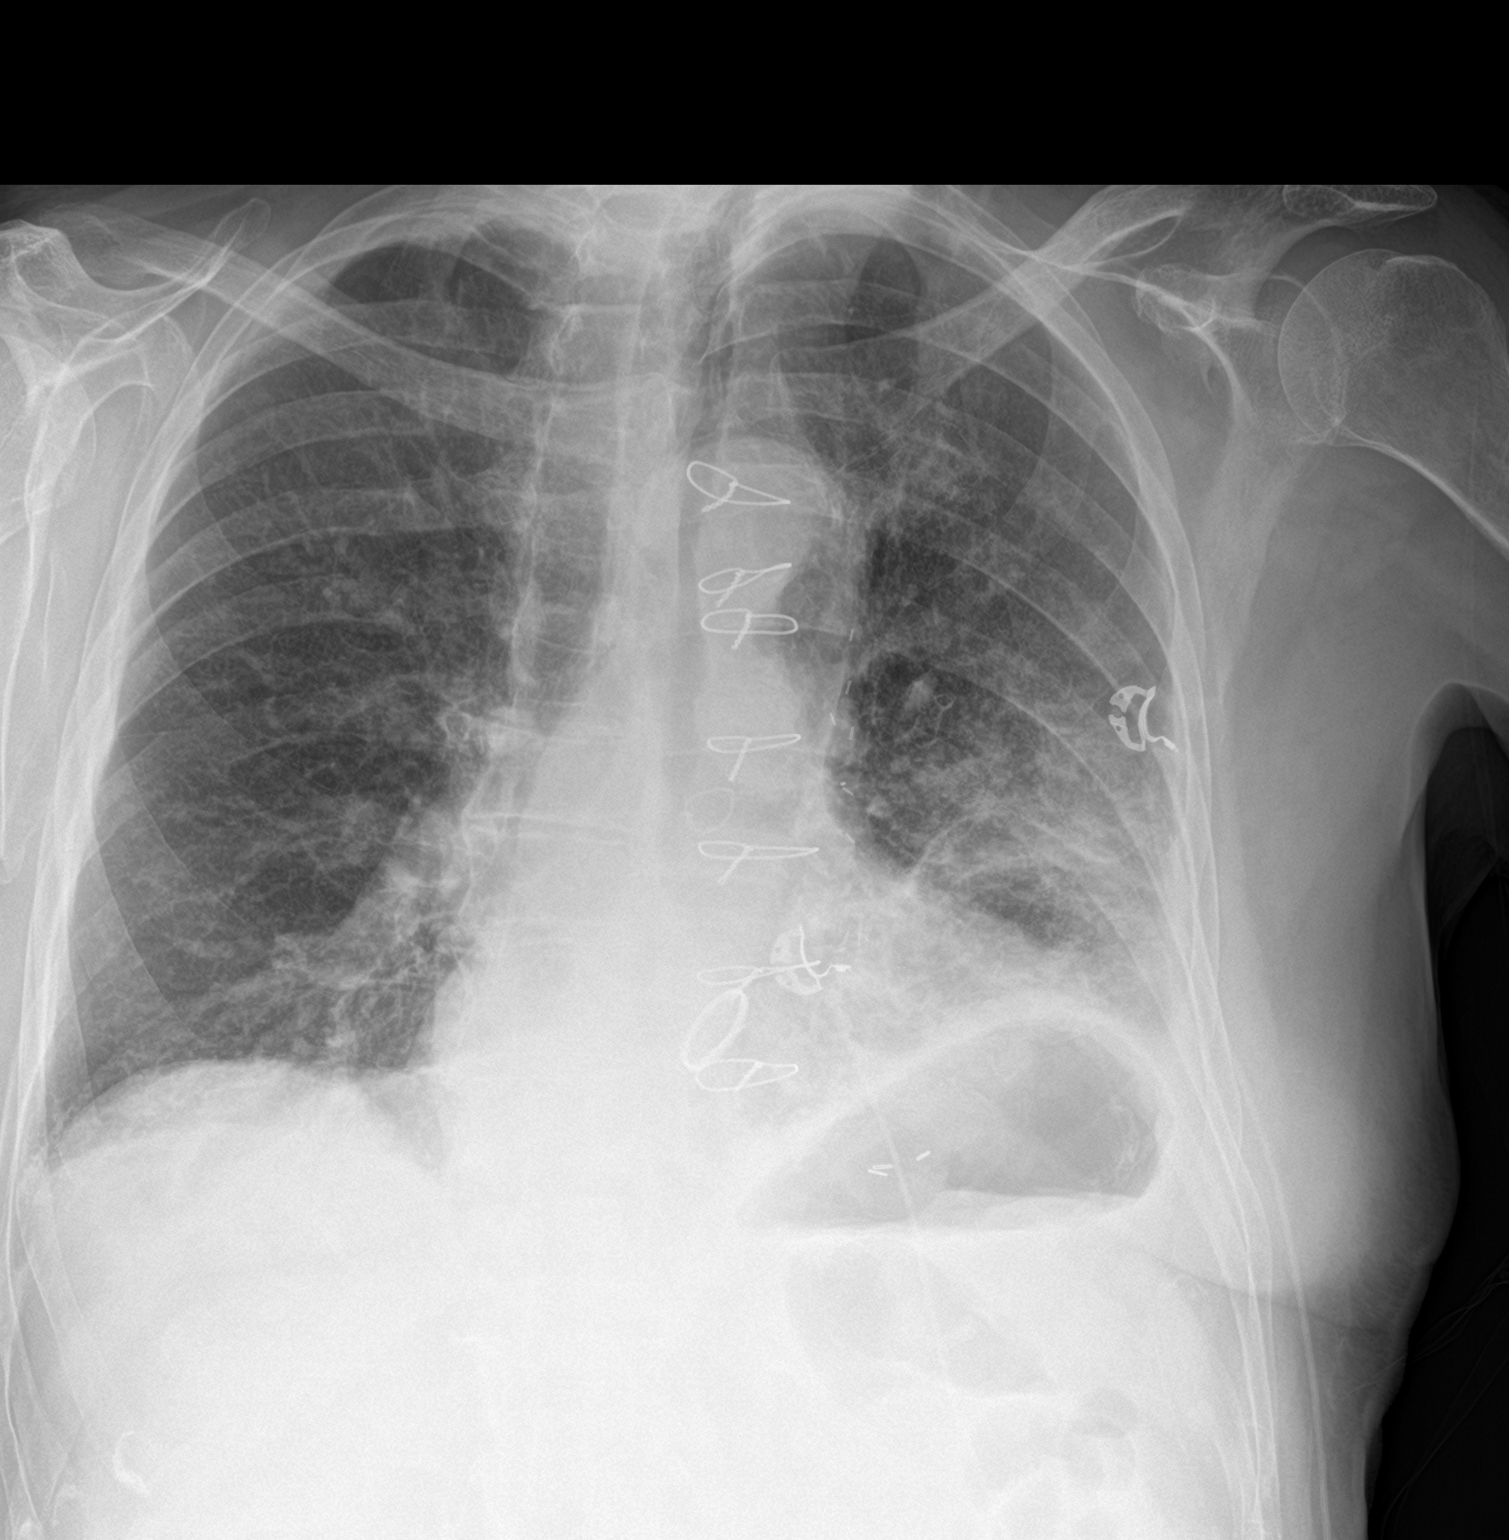

[chest lat]
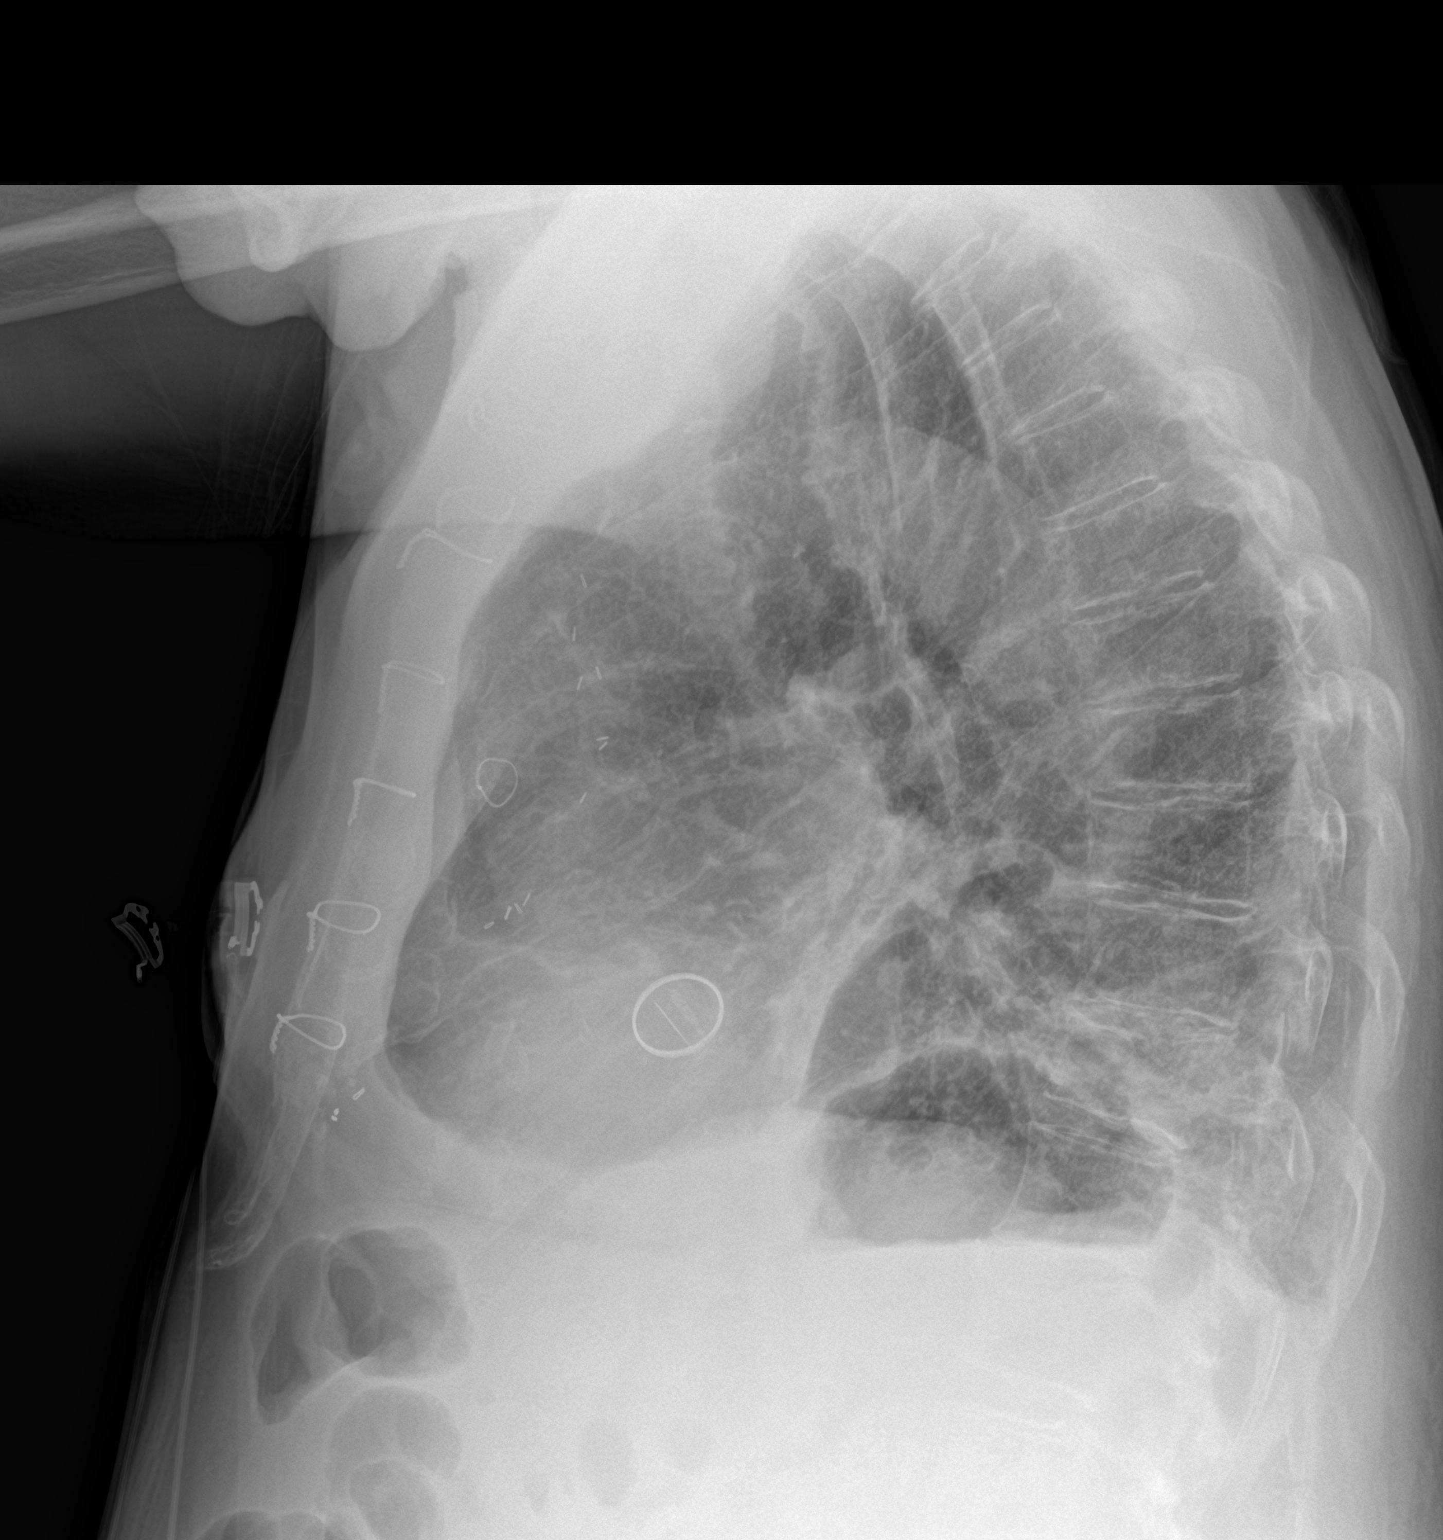

[2 of 2 positions shown; findings below may reference images not displayed]

FINDINGS: Postoperative changes in the mediastinum. Shallow inspiration with
elevation of the left hemidiaphragm. Airspace consolidation in the
left lung base consistent with pneumonia. Mild blunting of left
costophrenic angle may indicate a small pleural effusion. No
pneumothorax. Calcification of the aorta.
IMPRESSION: Left lower lobe airspace consolidation with probable small left
pleural effusion likely representing pneumonia.
